# Patient Record
Sex: Male | Born: 1965 | Race: Black or African American | Hispanic: No | Marital: Single | Smoking: Current every day smoker
Health system: Southern US, Community
[De-identification: ages and names within clinical notes are randomized; demographics above are authoritative.]

## PROBLEM LIST (undated history)

## (undated) DIAGNOSIS — R011 Cardiac murmur, unspecified: Secondary | ICD-10-CM

## (undated) DIAGNOSIS — I10 Essential (primary) hypertension: Secondary | ICD-10-CM

---

## 2003-07-14 ENCOUNTER — Emergency Department (HOSPITAL_COMMUNITY): Admission: AD | Admit: 2003-07-14 | Discharge: 2003-07-14 | Payer: Self-pay | Admitting: Family Medicine

## 2004-06-26 ENCOUNTER — Emergency Department (HOSPITAL_COMMUNITY): Admission: EM | Admit: 2004-06-26 | Discharge: 2004-06-26 | Payer: Self-pay | Admitting: Emergency Medicine

## 2005-06-05 ENCOUNTER — Emergency Department (HOSPITAL_COMMUNITY): Admission: EM | Admit: 2005-06-05 | Discharge: 2005-06-05 | Payer: Self-pay | Admitting: Emergency Medicine

## 2005-08-30 ENCOUNTER — Emergency Department (HOSPITAL_COMMUNITY): Admission: EM | Admit: 2005-08-30 | Discharge: 2005-08-30 | Payer: Self-pay | Admitting: Emergency Medicine

## 2006-01-05 ENCOUNTER — Ambulatory Visit: Payer: Self-pay | Admitting: Family Medicine

## 2006-01-05 ENCOUNTER — Encounter (INDEPENDENT_AMBULATORY_CARE_PROVIDER_SITE_OTHER): Payer: Self-pay | Admitting: Internal Medicine

## 2006-01-05 LAB — CONVERTED CEMR LAB: Microalbumin U total vol: 0.31 mg/L

## 2006-01-08 ENCOUNTER — Ambulatory Visit: Payer: Self-pay | Admitting: *Deleted

## 2006-03-09 ENCOUNTER — Ambulatory Visit: Payer: Self-pay | Admitting: Internal Medicine

## 2007-05-31 ENCOUNTER — Encounter (INDEPENDENT_AMBULATORY_CARE_PROVIDER_SITE_OTHER): Payer: Self-pay | Admitting: Internal Medicine

## 2007-05-31 DIAGNOSIS — F17200 Nicotine dependence, unspecified, uncomplicated: Secondary | ICD-10-CM

## 2007-05-31 DIAGNOSIS — E1165 Type 2 diabetes mellitus with hyperglycemia: Secondary | ICD-10-CM

## 2007-06-04 DIAGNOSIS — E785 Hyperlipidemia, unspecified: Secondary | ICD-10-CM

## 2007-06-04 DIAGNOSIS — E1169 Type 2 diabetes mellitus with other specified complication: Secondary | ICD-10-CM

## 2007-09-03 ENCOUNTER — Emergency Department (HOSPITAL_COMMUNITY): Admission: EM | Admit: 2007-09-03 | Discharge: 2007-09-04 | Payer: Self-pay | Admitting: Emergency Medicine

## 2007-10-03 ENCOUNTER — Ambulatory Visit: Payer: Self-pay | Admitting: Internal Medicine

## 2007-10-03 LAB — CONVERTED CEMR LAB
Cholesterol: 180 mg/dL (ref 0–200)
GC Probe Amp, Urine: NEGATIVE
LDL Cholesterol: 96 mg/dL (ref 0–99)
VLDL: 42 mg/dL — ABNORMAL HIGH (ref 0–40)

## 2007-10-10 ENCOUNTER — Encounter: Admission: RE | Admit: 2007-10-10 | Discharge: 2007-10-10 | Payer: Self-pay | Admitting: Family Medicine

## 2007-10-21 ENCOUNTER — Inpatient Hospital Stay (HOSPITAL_COMMUNITY): Admission: EM | Admit: 2007-10-21 | Discharge: 2007-10-26 | Payer: Self-pay | Admitting: Psychiatry

## 2007-10-21 ENCOUNTER — Emergency Department (HOSPITAL_COMMUNITY): Admission: EM | Admit: 2007-10-21 | Discharge: 2007-10-21 | Payer: Self-pay | Admitting: Emergency Medicine

## 2007-10-31 ENCOUNTER — Ambulatory Visit: Payer: Self-pay | Admitting: Psychiatry

## 2007-12-16 ENCOUNTER — Emergency Department (HOSPITAL_COMMUNITY): Admission: EM | Admit: 2007-12-16 | Discharge: 2007-12-16 | Payer: Self-pay | Admitting: Emergency Medicine

## 2008-03-25 ENCOUNTER — Emergency Department (HOSPITAL_COMMUNITY): Admission: EM | Admit: 2008-03-25 | Discharge: 2008-03-25 | Payer: Self-pay | Admitting: Emergency Medicine

## 2009-02-25 ENCOUNTER — Ambulatory Visit: Payer: Self-pay | Admitting: Internal Medicine

## 2009-06-20 ENCOUNTER — Emergency Department (HOSPITAL_COMMUNITY): Admission: EM | Admit: 2009-06-20 | Discharge: 2009-06-20 | Payer: Self-pay | Admitting: Emergency Medicine

## 2009-07-28 ENCOUNTER — Ambulatory Visit: Payer: Self-pay | Admitting: Internal Medicine

## 2009-07-28 LAB — CONVERTED CEMR LAB
BUN: 16 mg/dL (ref 6–23)
Benzodiazepines.: NEGATIVE
CO2: 20 meq/L (ref 19–32)
Calcium: 9.4 mg/dL (ref 8.4–10.5)
Cholesterol: 152 mg/dL (ref 0–200)
Cocaine Metabolites: POSITIVE — AB
Creatinine,U: 168.9 mg/dL
Glucose, Bld: 122 mg/dL — ABNORMAL HIGH (ref 70–99)
Methadone: NEGATIVE
Opiate Screen, Urine: NEGATIVE
Phencyclidine (PCP): NEGATIVE
Propoxyphene: NEGATIVE
Sodium: 138 meq/L (ref 135–145)
Triglycerides: 109 mg/dL (ref <150)

## 2009-09-22 ENCOUNTER — Emergency Department (HOSPITAL_COMMUNITY): Admission: EM | Admit: 2009-09-22 | Discharge: 2009-09-22 | Payer: Self-pay | Admitting: Emergency Medicine

## 2009-10-28 ENCOUNTER — Ambulatory Visit: Payer: Self-pay | Admitting: Internal Medicine

## 2010-06-23 ENCOUNTER — Emergency Department (HOSPITAL_COMMUNITY): Admission: EM | Admit: 2010-06-23 | Discharge: 2010-06-23 | Payer: Self-pay | Admitting: Emergency Medicine

## 2010-11-30 LAB — GLUCOSE, CAPILLARY: Glucose-Capillary: 432 mg/dL — ABNORMAL HIGH (ref 70–99)

## 2010-12-04 LAB — BASIC METABOLIC PANEL
BUN: 12 mg/dL (ref 6–23)
CO2: 29 mEq/L (ref 19–32)
Calcium: 9.1 mg/dL (ref 8.4–10.5)
Chloride: 104 mEq/L (ref 96–112)
GFR calc non Af Amer: >60 mL/min (ref >60)
Glucose, Bld: 207 mg/dL — ABNORMAL HIGH (ref 70–99)
Sodium: 139 mEq/L (ref 135–145)

## 2010-12-04 LAB — GLUCOSE, CAPILLARY

## 2011-01-31 NOTE — H&P (Signed)
Gregory Michael, Gregory Michael                 ACCOUNT NO.:  [id number]   MEDICAL RECORD NO.:  [id number]          PATIENT TYPE:  IPS   LOCATION:  0508                          FACILITY:  [facility]   PHYSICIAN:  Anselm Jungling, MD  DATE OF BIRTH:  08/07/1966   DATE OF ADMISSION:  10/21/2007  DATE OF DISCHARGE:                       PSYCHIATRIC ADMISSION ASSESSMENT   A 45 year old male voluntarily admitted on October 21, 2007.   HISTORY OF PRESENT ILLNESS:  The patient presents with a history of  homicidal thoughts towards people who have wronged him in the past,  hearing auditory hallucinations telling him to hurt others.  He has no  history of violence.  He states that he has found himself on a downward  slope. He is currently unemployed, homeless, and has been using  substances.   PAST PSYCHIATRIC HISTORY:  This is a first admission to the [facility].  No other apparent psychiatric admissions.  He is  currently not being treated by a mental health Janyiah Silveri.   SOCIAL HISTORY:  A 45 year old married male.  He is currently separated,  has two adult children.  He considers himself homeless and unemployed.   FAMILY PSYCHIATRIC HISTORY:  None.   SOCIAL HISTORY:  The patient smokes cigarettes and has been doing some  recent drinking and marijuana use.  Denies any seizure activity.  Primary care Weston Fulco is [facility].   MEDICAL PROBLEMS:  Non-insulin-dependent diabetes and hypertension.   MEDICATIONS:  1. Metformin 500 mg b.i.d.  2. Lisinopril 5 mg daily.  3. Triamcinolone cream to his rash on his chest.   DRUG ALLERGIES:  No known allergies.   PHYSICAL EXAMINATION:  The patient was fully assessed at [facility]  emergency department.  Temperature is 98.4, heart rate 87, respirations  18, blood pressure 127/70.  He is 5 feet 5 inches tall, 148 pounds.   WBC 13.7.  Alcohol level was 108.  Urine drug screen is positive for  THC.  Glucose 194.   This is a middle-age male,  appears well nourished.  No acute distress.   MENTAL STATUS EXAM:  He is fully alert, cooperative, polite, casually  dressed.  Speech is clear, normal pace and tone.  The patient's mood is  neutral.  The patient's affect is flat and sad.  Thought process:  Endorsing auditory hallucinations.  He does not appear to be responding  actively to internal stimuli at this time. Endorsing homicidal ideation.  Does not appear aggressive or suspicious.  Cognitive function intact.  His memory is good.  Judgment and insight fair.   AXIS I:  Depressive disorder not otherwise specified, rule out bipolar  disorder, rule out alcohol abuse rule abuse or dependence,  tetrahydrocannabinol abuse.  AXIS II:  Deferred.  AXIS III:  1. Non-insulin-dependent diabetes.  2. Hypertension.  AXIS IV:  Problems with housing, occupation, psychosocial problems,  medical problems.  AXIS V:  Current is 40.   PLAN:  Contract for safety, stabilize mood and thinking.  Will check his  blood sugars twice a day, resume his medications, reinforce medication  compliance. Will have Risperdal available  for mood stabilization and  psychotic symptoms.  We will continue to assess comorbidities and assess  his support group.  Case manager will obtain his followup appointments.  Will also monitor withdrawal symptoms, work on relapse prevention.   His tentative length of stay is to 3-5 days.      Landry Corporal, N.P.      Anselm Jungling, MD  Electronically Signed    JO/MEDQ  D:  10/22/2007  T:  10/22/2007  Job:  [id number]

## 2011-02-03 NOTE — Discharge Summary (Signed)
NAMEAYHAM, WORD                 ACCOUNT NO.:  [id number]   MEDICAL RECORD NO.:  [id number]          PATIENT TYPE:  IPS   LOCATION:  0508                          FACILITY:  [facility]   PHYSICIAN:  Anselm Jungling, MD  DATE OF BIRTH:  08/31/1966   DATE OF ADMISSION:  10/21/2007  DATE OF DISCHARGE:  10/26/2007                               DISCHARGE SUMMARY   IDENTIFYING DATA AND REASON FOR ADMISSION:  This was an inpatient  psychiatric admission for Gregory Michael, a 45 year old single, homeless African  American male, who presented with a history of suicidal and homicidal  thoughts and self-reported auditory hallucinations telling him to hurt  others.  He had also been involved in substance abuse.  Please refer to  the admission note for further details pertaining to symptoms,  circumstances and history that led to his hospitalization.  He was given  an initial Axis I diagnosis of depressive disorder NOS, rule out bipolar  disorder, rule out alcohol abuse disorder and cannabis abuse.   MEDICAL AND LABORATORY:  The patient came to [place] with a history of non-  insulin-dependent diabetes mellitus and hypertension, neither of which  had been carefully followed in the recent past.  His usual medications  included metformin, lisinopril and triamcinolone cream to a rash that he  had had on his chest.  He was medically and physically assessed by the  psychiatric nurse practitioner.  There were no acute medical issues.   HOSPITAL COURSE:  The patient was admitted to the adult inpatient  psychiatric service.  He presented as a well-nourished, well-developed  [place] male who was alert, fully oriented and pleasant but  grim with depressed mood.  His thoughts and speech were normally  organized.  He denied any suicidal ideation.  There were no signs or  symptoms of psychosis or thought disorder.  He talked at length about  his resentments about various persons close to him that he felt had  taken  advantage of him and betrayed him.  He expressed having thoughts  to hurt some of these people, but did not name them, and indicated  clearly throughout his stay that he did not want to commit any acts of  violence, but he merely had those feelings.   He was involved in the therapeutic milieu, and was a good participant in  the treatment program.  He attended therapeutic groups and activities  geared towards developing better understanding of his underlying  disorders and dynamics, the development of an aftercare plan, and in  addition he attended 12-step recovery groups.  Although he denied much  in the way of active substance abuse, he agreed fully that he was an  individual that needed to live a clean and sober life, and because of  this he was accepting of the idea of attending 12-step meetings.   He was treated with a regimen of Risperdal 0.5 mg t.i.d. after an  initial trial of 0.25 mg t.i.d., to address agitation, tension and  difficulty sleeping.  Risperdal was very helpful and well tolerated.  The patient indicated enthusiasm about  continuing to use it.  He was  also given trazodone 50-100 mg at bedtime to help stabilize sleep.   The patient worked closely with the undersigned and the case manager  towards an aftercare plan.  We encouraged him to explore the possibility  of living in an [facility] for men in early recovery from alcohol and  drug abuse.  He was agreeable to that overall plan.   On the sixth hospital day the patient appeared stable enough for  discharge and agreed to following aftercare plan.   AFTERCARE:  The patient was to follow up at [facility] with an appointment on February 10.   DISCHARGE MEDICATIONS:  1. Glucophage 500 mg b.i.d.  2. Risperdal 0.5 mg t.i.d.  3. Prinivil 5 mg daily.  4. Triamcinolone cream twice daily to rash.  5. Trazodone 50-100 mg daily at bedtime p.r.n. insomnia.   DISCHARGE DIAGNOSES:  AXIS I:  Mood  disorder NOS.  Also, history of  substance abuse disorder NOS, early remission.  AXIS II:  Deferred.  AXIS III:  History of hypertension, rash, diabetes mellitus.  AXIS IV:  Stressors severe.  AXIS V:  GAF on discharge 55.      Anselm Jungling, MD  Electronically Signed     SPB/MEDQ  D:  10/29/2007  T:  10/30/2007  Job:  [id number]

## 2011-06-09 LAB — COMPREHENSIVE METABOLIC PANEL
ALT: 22
ALT: 23
AST: 18
AST: 18
Alkaline Phosphatase: 77
BUN: 18
CO2: 26
CO2: 28
Calcium: 9.4
Chloride: 104
Creatinine, Ser: 1.13
GFR calc non Af Amer: >60
Glucose, Bld: 152 — ABNORMAL HIGH
Glucose, Bld: 194 — ABNORMAL HIGH
Potassium: 3.8
Sodium: 138
Total Bilirubin: 0.6

## 2011-06-09 LAB — RAPID URINE DRUG SCREEN, HOSP PERFORMED
Barbiturates: NOT DETECTED
Cocaine: NOT DETECTED
Opiates: NOT DETECTED

## 2011-06-09 LAB — ETHANOL: Alcohol, Ethyl (B): 108 — ABNORMAL HIGH

## 2011-06-09 LAB — DIFFERENTIAL
Basophils Absolute: 0.1
Basophils Relative: 0
Eosinophils Absolute: 0.1
Eosinophils Relative: 1
Lymphs Abs: 2.4
Monocytes Absolute: 0.9
Monocytes Relative: 6
Neutro Abs: 10.3 — ABNORMAL HIGH
Neutrophils Relative %: 75

## 2011-06-09 LAB — CBC
HCT: 42.5
HCT: 46.8
Hemoglobin: 16.1
MCHC: 34.9
RDW: 13.6
RDW: 14.1
WBC: 12.4 — ABNORMAL HIGH

## 2011-06-23 LAB — CBC
Platelets: 199
WBC: 12.1 — ABNORMAL HIGH

## 2011-06-23 LAB — I-STAT 8, (EC8 V) (CONVERTED LAB)
Acid-Base Excess: 1
BUN: 13
Chloride: 103
Glucose, Bld: 160 — ABNORMAL HIGH
Hemoglobin: 16.3
Potassium: 3.3 — ABNORMAL LOW
TCO2: 26
pCO2, Ven: 34.5 — ABNORMAL LOW
pH, Ven: 7.461 — ABNORMAL HIGH

## 2011-06-23 LAB — DIFFERENTIAL
Basophils Absolute: 0
Lymphocytes Relative: 10 — ABNORMAL LOW
Monocytes Absolute: 1.1 — ABNORMAL HIGH
Neutro Abs: 9.8 — ABNORMAL HIGH

## 2011-06-23 LAB — URINALYSIS, ROUTINE W REFLEX MICROSCOPIC
Ketones, ur: 15 — AB
Protein, ur: NEGATIVE
Specific Gravity, Urine: 1.027
Urobilinogen, UA: 1
pH: 5.5

## 2011-06-23 LAB — INFLUENZA A+B VIRUS AG-DIRECT(RAPID): Influenza B Ag: NEGATIVE

## 2011-06-23 LAB — POCT I-STAT CREATININE: Creatinine, Ser: 1.2

## 2012-04-13 ENCOUNTER — Encounter (HOSPITAL_COMMUNITY): Payer: Self-pay | Admitting: Nurse Practitioner

## 2012-04-13 ENCOUNTER — Emergency Department (HOSPITAL_COMMUNITY)
Admission: EM | Admit: 2012-04-13 | Discharge: 2012-04-13 | Disposition: A | Discharge status: Home / Self Care | Payer: Self-pay | Attending: Emergency Medicine | Admitting: Emergency Medicine

## 2012-04-13 DIAGNOSIS — R42 Dizziness and giddiness: Secondary | ICD-10-CM | POA: Insufficient documentation

## 2012-04-13 DIAGNOSIS — E119 Type 2 diabetes mellitus without complications: Secondary | ICD-10-CM | POA: Insufficient documentation

## 2012-04-13 DIAGNOSIS — R011 Cardiac murmur, unspecified: Secondary | ICD-10-CM | POA: Insufficient documentation

## 2012-04-13 DIAGNOSIS — F17200 Nicotine dependence, unspecified, uncomplicated: Secondary | ICD-10-CM | POA: Insufficient documentation

## 2012-04-13 HISTORY — DX: Cardiac murmur, unspecified: R01.1

## 2012-04-13 LAB — POCT I-STAT, CHEM 8
Calcium, Ion: 1.24 mmol/L — ABNORMAL HIGH (ref 1.12–1.23)
Creatinine, Ser: 0.9 mg/dL (ref 0.50–1.35)
Glucose, Bld: 200 mg/dL — ABNORMAL HIGH (ref 70–99)
HCT: 44 % (ref 39.0–52.0)
Hemoglobin: 15 g/dL (ref 13.0–17.0)
Potassium: 3.9 mEq/L (ref 3.5–5.1)
TCO2: 24 mmol/L (ref 0–100)

## 2012-04-13 LAB — GLUCOSE, CAPILLARY: Glucose-Capillary: 204 mg/dL — ABNORMAL HIGH (ref 70–99)

## 2012-04-13 NOTE — ED Provider Notes (Signed)
History     CSN: [id number]  Arrival date & time 04/13/12  0917   First MD Initiated Contact with Patient 04/13/12 1011      Chief Complaint  Patient presents with  . Dizziness    (Consider location/radiation/quality/duration/timing/severity/associated sxs/prior treatment) HPI Comments: Patient with history of diabetes controlled with metformin, presents with three-day history of lightheadedness. Patient states that he becomes very lightheaded when he stands or gets up after sleeping. He states that it feels like he is going to pass out. Patient does not have any palpitations or chest pain. He denies head injury or headache. He denies weakness in his extremities. He denies trouble talking. He denies confusion. Patient states that he has been under much more stress than usual lately and thinks that the stress is causing his symptoms. He states he has also been having trouble sleeping. Nothing makes symptoms better. Standing makes symptoms worse. Onset was acute. Course is intermittent. 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  The history is provided by the patient.    Past Medical History  Diagnosis Date  . Diabetes mellitus   . Heart murmur     History reviewed. No pertinent past surgical history.  History reviewed. No pertinent family history.  History  Substance Use Topics  . Smoking status: Current Everyday Smoker -- 0.5 packs/day  . Smokeless tobacco: Not on file  . Alcohol Use: Yes     rare      Review of Systems  Constitutional: Negative for fever.  HENT: Positive for congestion and rhinorrhea. Negative for sore throat.   Eyes: Negative for redness and visual disturbance.  Respiratory: Negative for cough.   Cardiovascular: Negative for chest pain, palpitations and leg swelling.  Gastrointestinal: Negative for nausea, vomiting, abdominal pain and diarrhea.  Genitourinary: Negative for dysuria.    Musculoskeletal: Negative for myalgias.  Skin: Negative for rash.  Neurological: Positive for light-headedness. Negative for numbness and headaches.  Psychiatric/Behavioral: Negative for confusion.    Allergies  Insulin detemir  Home Medications   Current Outpatient Rx  Name Route Sig Dispense Refill  . METFORMIN HCL 500 MG PO TABS Oral Take 500 mg by mouth 2 (two) times daily with a meal.      BP 121/72  Pulse 85  Temp 98.5 F (36.9 C) (Oral)  Resp 16  SpO2 98%  Physical Exam  Nursing note and vitals reviewed. Constitutional: He appears well-developed and well-nourished.  HENT:  Head: Normocephalic and atraumatic.  Right Ear: Tympanic membrane, external ear and ear canal normal.  Left Ear: Tympanic membrane, external ear and ear canal normal.  Nose: Nose normal. No mucosal edema or rhinorrhea.  Mouth/Throat: Uvula is midline and oropharynx is clear and moist. Mucous membranes are not dry.  Eyes: Conjunctivae are normal. Right eye exhibits no discharge. Left eye exhibits no discharge.  Neck: Normal range of motion. Neck supple.  Cardiovascular: Normal rate, regular rhythm and normal heart sounds.   Pulmonary/Chest: Effort normal and breath sounds normal.  Abdominal: Soft. There is no tenderness.  Neurological: He is alert.  Skin: Skin is warm and dry.  Psychiatric: He has a normal mood and affect.    ED Course  Procedures (including critical care time)  Labs Reviewed  GLUCOSE, CAPILLARY - Abnormal; Notable for the following:    Glucose-Capillary 204 (*)     All other components within normal limits   No results found.   1. Lightheadedness  10:32 AM Patient seen and examined. Work-up initiated.   Vital signs reviewed and are as follows: Filed Vitals:   04/13/12 0935  BP: 121/72  Pulse: 85  Temp: 98.5 F (36.9 C)  Resp: 16    Date: 04/13/2012  Rate: 71  Rhythm: normal sinus rhythm  QRS Axis: normal, borderline LAD  Intervals: normal  ST/T  Wave abnormalities: normal  Conduction Disutrbances:none  Narrative Interpretation: no qt prolongation, WPW, brugada  Old EKG Reviewed: none available  Patient was discussed with Dr. Effie Shy. No concerning findings on work-up. Patient informed of results and reassured. Urged rest and good hydration. Patient to continue DM treatment. Encouraged patient to establish care with PCP for further eval and routine care. Urged to return with CP, SOB, syncope, other concerns. Patient verbalizes understanding and agrees with plan.    MDM  Lightheadedness with standing suggesting orthostasis. No concerning findings on work-up. No SOB, CP. EKG unconcerning. Return instructions given. Do not suspect PE, cardiac etiology.         [place], [place] 04/13/12 [phone]

## 2012-04-13 NOTE — ED Notes (Signed)
Pt c/o "feeling lightheaded" x 3 days. Feels it may be r/t his blood sugar but does not have a meter. Denies vision changes or LOC. Denies pain. A&Ox4

## 2012-04-13 NOTE — ED Notes (Signed)
Reports intermittent episodes dizziness x 2-3 days especially with position changes. Had emesis x1 2 days ago.  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 Denies any h/a, chest pain.  No c/o dizziness presently.

## 2012-04-14 NOTE — ED Provider Notes (Signed)
Medical screening examination/treatment/procedure(s) were performed by non-physician practitioner and as supervising physician I was immediately available for consultation/collaboration.  Flint Melter, MD 04/14/12 2124

## 2012-12-24 ENCOUNTER — Encounter (HOSPITAL_COMMUNITY): Payer: Self-pay

## 2012-12-24 ENCOUNTER — Emergency Department (HOSPITAL_COMMUNITY)
Admission: EM | Admit: 2012-12-24 | Discharge: 2012-12-24 | Disposition: A | Discharge status: Home / Self Care | Payer: Self-pay | Source: Home / Self Care | Attending: Family Medicine | Admitting: Family Medicine

## 2012-12-24 DIAGNOSIS — F17200 Nicotine dependence, unspecified, uncomplicated: Secondary | ICD-10-CM

## 2012-12-24 DIAGNOSIS — E119 Type 2 diabetes mellitus without complications: Secondary | ICD-10-CM

## 2012-12-24 DIAGNOSIS — E785 Hyperlipidemia, unspecified: Secondary | ICD-10-CM | POA: Diagnosis present

## 2012-12-24 DIAGNOSIS — IMO0001 Reserved for inherently not codable concepts without codable children: Secondary | ICD-10-CM | POA: Diagnosis present

## 2012-12-24 LAB — CBC
Hemoglobin: 16.9 g/dL (ref 13.0–17.0)
MCH: 31.9 pg (ref 26.0–34.0)
MCHC: 36.9 g/dL — ABNORMAL HIGH (ref 30.0–36.0)
RDW: 12.8 % (ref 11.5–15.5)

## 2012-12-24 LAB — COMPREHENSIVE METABOLIC PANEL
ALT: 19 U/L (ref 0–53)
Albumin: 4.3 g/dL (ref 3.5–5.2)
Calcium: 10.4 mg/dL (ref 8.4–10.5)
GFR calc Af Amer: >90 mL/min (ref >90)
Glucose, Bld: 266 mg/dL — ABNORMAL HIGH (ref 70–99)
Sodium: 136 mEq/L (ref 135–145)
Total Protein: 7.5 g/dL (ref 6.0–8.3)

## 2012-12-24 LAB — LIPID PANEL
Cholesterol: 213 mg/dL — ABNORMAL HIGH (ref 0–200)
HDL: 47 mg/dL (ref >39)
Triglycerides: 249 mg/dL — ABNORMAL HIGH (ref <150)
VLDL: 50 mg/dL — ABNORMAL HIGH (ref 0–40)

## 2012-12-24 MED ORDER — METFORMIN HCL ER 500 MG PO TB24: 500.0000 mg | ORAL_TABLET | Freq: Two times a day (BID) | ORAL | Status: DC

## 2012-12-24 NOTE — ED Notes (Signed)
Patient states here for A1C and medicatioin refill History of DM

## 2012-12-24 NOTE — ED Provider Notes (Signed)
History    CSN: [id number]  Arrival date & time 12/24/12  1224   First MD Initiated Contact with Patient 12/24/12 1426      Chief Complaint  Patient presents with  . Diabetes  . Medication Refill   HPI Pt presents today to get refills of his metformin and to have his A1c checked.  He has a history of a heart murmur.  He says that he has not been follow a medical doctor regularly and has not been checked in over a year.  He is not keen for regular office visits.  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    Past Medical History  Diagnosis Date  . Diabetes mellitus   . Heart murmur     History reviewed. No pertinent past surgical history.  Family History  Diabetes Mellitus   History  Substance Use Topics  . Smoking status: Current Every Day Smoker -- 0.50 packs/day  . Smokeless tobacco: Not on file  . Alcohol Use: Yes     Comment: rare    Review of Systems Constitutional: Negative.  HENT: Negative.  Respiratory: Negative.  Cardiovascular: Negative.  Gastrointestinal: Negative.  Endocrine: Negative.  Genitourinary: Negative.  Musculoskeletal: Negative.  Skin: Negative.  Allergic/Immunologic: Negative.  Neurological: Negative.  Hematological: Negative.  Psychiatric/Behavioral: Negative.  All other systems reviewed and are negative   Allergies  Insulin detemir  Home Medications   Current Outpatient Rx  Name  Route  Sig  Dispense  Refill  . metFORMIN (GLUCOPHAGE) 500 MG tablet   Oral   Take 500 mg by mouth 2 (two) times daily with a meal.           BP 140/82  Pulse 84  Temp(Src) 98.3 F (36.8 C) (Oral)  Resp 20  SpO2 100%  Physical Exam Nursing note and vitals reviewed.  Constitutional: He is oriented to person, place, and time. He appears well-developed and well-nourished. No distress.  Eyes: Conjunctivae and EOM are normal. Pupils are equal, round, and reactive to light.  Neck: Normal range of motion.  Neck supple. No JVD present. No thyromegaly present.  Cardiovascular: Normal rate, regular rhythm and normal heart sounds.  No murmur heard.  Pulmonary/Chest: Effort normal and breath sounds normal. No respiratory distress.  Abdominal: Soft. Bowel sounds are normal.  Musculoskeletal: Normal range of motion. He exhibits no edema.  Lymphadenopathy:  He has no cervical adenopathy.  Neurological: He is oriented to person, place, and time. Coordination normal.  Skin: Skin is warm and dry. No rash noted. No erythema. No pallor.  Psychiatric: He has a normal mood and affect. His behavior is normal. Judgment and thought content normal.   ED Course  Procedures (including critical care time)  Labs Reviewed - No data to display No results found.  No diagnosis found.  MDM  IMPRESSION  Type 2 diabetes mellitus  Elevated blood pressure   History of hyperlipidemia   RECOMMENDATIONS / PLAN Check lipids, check other labs today  Refilled metformin medication The patient was counseled on the dangers of tobacco use, and was advised to quit.  Reviewed strategies to maximize success, including removing cigarettes and smoking materials from environment and stress management.   FOLLOW UP 4 months   The patient was given clear instructions to go to ER or return to medical center if symptoms don't improve, worsen or new problems develop.  The patient verbalized understanding.  The patient was told to call  to get lab results if they haven't heard anything in the next week.            Cleora Fleet, MD 12/24/12 1435

## 2012-12-25 ENCOUNTER — Telehealth (HOSPITAL_COMMUNITY): Payer: Self-pay

## 2012-12-25 LAB — HEMOGLOBIN A1C: Hgb A1c MFr Bld: 10 % — ABNORMAL HIGH (ref <5.7)

## 2012-12-25 LAB — MICROALBUMIN / CREATININE URINE RATIO
Creatinine, Urine: 80.4 mg/dL
Microalb Creat Ratio: 6.2 mg/g (ref 0.0–30.0)

## 2012-12-25 NOTE — ED Notes (Signed)
Left message on machine to please return our call

## 2012-12-25 NOTE — Progress Notes (Signed)
Quick Note:  Also, the patient had a low Vit D level so please call in some Vit D (Drisdol 50,000 IU caps) - take 1 po once per week, #8, No refills.   Rodney Langton, MD, CDE, FAAFP [facility] [place], [place]   ______

## 2012-12-25 NOTE — Progress Notes (Signed)
Quick Note:  Please inform patient that his blood sugar came back elevated and his A1c is 10% which means that his diabetes is poorly controlled. He is at high risk for acute and chronic complications of poorly controlled diabetes mellitus. He needs to increase his metformin to 2 tabs with breakfast and 2 tabs with supper for a total of 1000 mg twice daily. He needs to work on improving his diet. No regular sodas or concentrated sweets. His cholesterol is elevated and he needs to take some cholesterol lowering medications. Please call in Pravastatin 40 mg - Take 1 po QHS, #30, RFx 3. Pt needs to return in 3 months and have his labs rechecked and follow up on diabetes care.   Rodney Langton, MD, CDE, FAAFP [facility] [place], [place]   ______

## 2012-12-25 NOTE — ED Notes (Signed)
Spoke with patient about his labs Made him aware to increase his metformin to 2 tabs with breakfast and dinner Watch his diet Prescription for pravastatin 40mg  & vit D called into [facility] [place] [phone]- left on pharmacy machine

## 2013-01-29 ENCOUNTER — Telehealth: Payer: Self-pay

## 2013-01-29 NOTE — Telephone Encounter (Signed)
Diabetes script written on 12/24/12 has expired. Pharmacy,  [facility], asking to have script resent.

## 2013-02-03 NOTE — Telephone Encounter (Signed)
Pt called again, [facility] still has not received script.

## 2013-04-02 NOTE — Telephone Encounter (Signed)
Left message for pt to schedule appt for f/u.

## 2013-04-10 ENCOUNTER — Ambulatory Visit: Payer: Commercial Managed Care - PPO | Attending: Family Medicine | Admitting: Family Medicine

## 2013-04-10 ENCOUNTER — Encounter: Payer: Self-pay | Admitting: Family Medicine

## 2013-04-10 VITALS — BP 151/103 | HR 97 | Temp 99.0°F | Resp 16 | Wt 163.0 lb

## 2013-04-10 DIAGNOSIS — E559 Vitamin D deficiency, unspecified: Secondary | ICD-10-CM | POA: Insufficient documentation

## 2013-04-10 DIAGNOSIS — E785 Hyperlipidemia, unspecified: Secondary | ICD-10-CM | POA: Insufficient documentation

## 2013-04-10 DIAGNOSIS — E1110 Type 2 diabetes mellitus with ketoacidosis without coma: Secondary | ICD-10-CM

## 2013-04-10 DIAGNOSIS — IMO0001 Reserved for inherently not codable concepts without codable children: Secondary | ICD-10-CM | POA: Insufficient documentation

## 2013-04-10 DIAGNOSIS — I10 Essential (primary) hypertension: Secondary | ICD-10-CM | POA: Insufficient documentation

## 2013-04-10 DIAGNOSIS — E1310 Other specified diabetes mellitus with ketoacidosis without coma: Secondary | ICD-10-CM

## 2013-04-10 DIAGNOSIS — F17200 Nicotine dependence, unspecified, uncomplicated: Secondary | ICD-10-CM

## 2013-04-10 LAB — COMPLETE METABOLIC PANEL WITH GFR
Albumin: 4.4 g/dL (ref 3.5–5.2)
BUN: 11 mg/dL (ref 6–23)
Calcium: 9.8 mg/dL (ref 8.4–10.5)
Chloride: 102 mEq/L (ref 96–112)
GFR, Est African American: >89 mL/min
GFR, Est Non African American: >89 mL/min
Glucose, Bld: 309 mg/dL — ABNORMAL HIGH (ref 70–99)
Potassium: 4.3 mEq/L (ref 3.5–5.3)

## 2013-04-10 LAB — GLUCOSE, POCT (MANUAL RESULT ENTRY): POC Glucose: 314 mg/dl — AB (ref 70–99)

## 2013-04-10 LAB — LIPID PANEL: Cholesterol: 177 mg/dL (ref 0–200)

## 2013-04-10 LAB — TSH: TSH: 0.684 u[IU]/mL (ref 0.350–4.500)

## 2013-04-10 MED ORDER — RELION CONFIRM GLUCOSE MONITOR W/DEVICE KIT: 1.0000 [IU] | PACK | Status: AC

## 2013-04-10 MED ORDER — METFORMIN HCL ER 500 MG PO TB24: 500.0000 mg | ORAL_TABLET | Freq: Two times a day (BID) | ORAL | Status: DC

## 2013-04-10 MED ORDER — LISINOPRIL-HYDROCHLOROTHIAZIDE 10-12.5 MG PO TABS: 1.0000 | ORAL_TABLET | Freq: Every day | ORAL | Status: DC

## 2013-04-10 MED ORDER — PRAVASTATIN SODIUM 40 MG PO TABS: 40.0000 mg | ORAL_TABLET | Freq: Every day | ORAL | Status: DC

## 2013-04-10 MED ORDER — GLUCOSE BLOOD VI STRP: ORAL_STRIP | Status: AC

## 2013-04-10 NOTE — Progress Notes (Signed)
Patient ID: Gregory Michael, male   DOB: March 27, 1966, 47 y.o.   MRN: [id number]  CC: follow up   HPI: Pt says that he needs refills of his medications.  Pt has not taken metformin for 1 week.  Pt works as a [employer].  Pt is still smoking cigarettes.  Pt very hesitant to take blood pressure medications.  He says that he is not keen on taking medications.  He says that when he doesn't take his metformin he feels ill from the effects of hyperglycemia.   Allergies  Allergen Reactions  . Insulin Detemir     REACTION: Rash   Past Medical History  Diagnosis Date  . Diabetes mellitus   . Heart murmur    No current outpatient prescriptions on file prior to visit.   No current facility-administered medications on file prior to visit.   No family history on file. History   Social History  . Marital Status: Single    Spouse Name: N/A    Number of Children: N/A  . Years of Education: N/A   Occupational History  . Not on file.   Social History Main Topics  . Smoking status: Current Every Day Smoker -- 0.50 packs/day  . Smokeless tobacco: Not on file  . Alcohol Use: Yes     Comment: rare  . Drug Use: No  . Sexually Active:    Other Topics Concern  . Not on file   Social History Narrative  . No narrative on file    Review of Systems  Constitutional: Negative for fever, chills, diaphoresis, activity change, appetite change and fatigue.  HENT: Negative for ear pain, nosebleeds, congestion, facial swelling, rhinorrhea, neck pain, neck stiffness and ear discharge.   Eyes: Negative for pain, discharge, redness, itching and visual disturbance.  Respiratory: Negative for cough, choking, chest tightness, shortness of breath, wheezing and stridor.   Cardiovascular: Negative for chest pain, palpitations and leg swelling.  Gastrointestinal: Negative for abdominal distention.  Genitourinary: Negative for dysuria, urgency, frequency, hematuria, flank pain, decreased urine volume, difficulty  urinating and dyspareunia.  Musculoskeletal: Negative for back pain, joint swelling, arthralgias and gait problem.  Neurological: Negative for dizziness, tremors, seizures, syncope, facial asymmetry, speech difficulty, weakness, light-headedness, numbness and headaches.  Hematological: Negative for adenopathy. Does not bruise/bleed easily.  Psychiatric/Behavioral: Negative for hallucinations, behavioral problems, confusion, dysphoric mood, decreased concentration and agitation.    Objective:   Filed Vitals:   04/10/13 1245  BP: 151/103  Pulse:   Temp:   Resp:     Physical Exam  Constitutional: Appears well-developed and well-nourished. No distress.  HENT: Normocephalic. External right and left ear normal. Oropharynx is clear and dry mucous membranes. dental caries.  Eyes: Conjunctivae and EOM are normal. PERRLA, no scleral icterus.  Neck: Normal ROM. Neck supple. No JVD. No tracheal deviation. No thyromegaly.  CVS: RRR, S1/S2 +, no murmurs, no gallops, no carotid bruit.  Pulmonary: Effort and breath sounds normal, no stridor, rhonchi, wheezes, rales.  Abdominal: Soft. BS +,  no distension, tenderness, rebound or guarding.  Musculoskeletal: Normal range of motion. No edema and no tenderness.  Lymphadenopathy: No lymphadenopathy noted, cervical, inguinal. Neuro: Alert. Normal reflexes, muscle tone coordination. No cranial nerve deficit. Skin: Skin is warm and dry. No rash noted. Not diaphoretic. No erythema. No pallor.  Psychiatric: Normal mood and affect. Behavior, judgment, thought content normal.   Lab Results  Component Value Date   WBC 8.6 12/24/2012   HGB 16.9 12/24/2012   HCT  45.8 12/24/2012   MCV 86.6 12/24/2012   PLT 217 12/24/2012   Lab Results  Component Value Date   CREATININE 0.78 12/24/2012   BUN 11 12/24/2012   NA 136 12/24/2012   K 4.2 12/24/2012   CL 97 12/24/2012   CO2 28 12/24/2012    Lab Results  Component Value Date   HGBA1C 8.9% 04/10/2013   Lipid Panel      Component Value Date/Time   CHOL 213* 12/24/2012 1441   TRIG 249* 12/24/2012 1441   HDL 47 12/24/2012 1441   CHOLHDL 4.5 12/24/2012 1441   VLDL 50* 12/24/2012 1441   LDLCALC 116* 12/24/2012 1441       Assessment and plan:   Patient Active Problem List   Diagnosis Date Noted  . DM (diabetes mellitus) type 2, uncontrolled, with ketoacidosis 04/10/2013  . Unspecified essential hypertension 04/10/2013  . Unspecified vitamin D deficiency 04/10/2013  . Elevated blood pressure 12/24/2012  . Hyperlipidemia 12/24/2012  . HYPERLIPIDEMIA 06/04/2007  . DIABETES MELLITUS, TYPE II 05/31/2007  . DISORDER, TOBACCO USE 05/31/2007   DM (diabetes mellitus) type 2, uncontrolled, with ketoacidosis - Plan: HgB A1c, Glucose (CBG), Lipid panel, COMPLETE METABOLIC PANEL WITH GFR, TSH, Vitamin D 25 hydroxy, Microalbumin / creatinine urine ratio  Unspecified essential hypertension - Plan: Lipid panel, COMPLETE METABOLIC PANEL WITH GFR, TSH, Vitamin D 25 hydroxy, Microalbumin / creatinine urine ratio  Unspecified vitamin D deficiency - Plan: Lipid panel, COMPLETE METABOLIC PANEL WITH GFR, TSH, Vitamin D 25 hydroxy, Microalbumin / creatinine urine ratio  HYPERLIPIDEMIA  DISORDER, TOBACCO USE  The patient was counseled on the dangers of tobacco use, and was advised to quit.  Reviewed strategies to maximize success, including removing cigarettes and smoking materials from environment.  Trial of lisinopril HCT 10/12.5 take 1 po daily  Follow lab results  Counseled on medications  The patient was given clear instructions to go to ER or return to medical center if symptoms don't improve, worsen or new problems develop.  The patient verbalized understanding.  The patient was told to call to get any lab results if not heard anything in the next week.    RTC in 3 months  Rodney Langton, MD, CDE, FAAFP [facility] [place], [place]

## 2013-04-10 NOTE — Progress Notes (Signed)
Patient here for follow up on DM Medication refill

## 2013-04-10 NOTE — Patient Instructions (Addendum)
DASH Diet The DASH diet stands for "Dietary Approaches to Stop Hypertension." It is a healthy eating plan that has been shown to reduce high blood pressure (hypertension) in as little as 14 days, while also possibly providing other significant health benefits. These other health benefits include reducing the risk of breast cancer after menopause and reducing the risk of type 2 diabetes, heart disease, colon cancer, and stroke. Health benefits also include weight loss and slowing kidney failure in patients with chronic kidney disease.  DIET GUIDELINES  Limit salt (sodium). Your diet should contain less than 1500 mg of sodium daily.  Limit refined or processed carbohydrates. Your diet should include mostly whole grains. Desserts and added sugars should be used sparingly.  Include small amounts of heart-healthy fats. These types of fats include nuts, oils, and tub margarine. Limit saturated and trans fats. These fats have been shown to be harmful in the body. CHOOSING FOODS  The following food groups are based on a 2000 calorie diet. See your Registered Dietitian for individual calorie needs. Grains and Grain Products (6 to 8 servings daily)  Eat More Often: Whole-wheat bread, brown rice, whole-grain or wheat pasta, quinoa, popcorn without added fat or salt (air popped).  Eat Less Often: White bread, white pasta, white rice, cornbread. Vegetables (4 to 5 servings daily)  Eat More Often: Fresh, frozen, and canned vegetables. Vegetables may be raw, steamed, roasted, or grilled with a minimal amount of fat.  Eat Less Often/Avoid: Creamed or fried vegetables. Vegetables in a cheese sauce. Fruit (4 to 5 servings daily)  Eat More Often: All fresh, canned (in natural juice), or frozen fruits. Dried fruits without added sugar. One hundred percent fruit juice ( cup [237 mL] daily).  Eat Less Often: Dried fruits with added sugar. Canned fruit in light or heavy syrup. Lean Meats, Fish, and Poultry (2  servings or less daily. One serving is 3 to 4 oz [85-114 g]).  Eat More Often: Ninety percent or leaner ground beef, tenderloin, sirloin. Round cuts of beef, chicken breast, turkey breast. All fish. Grill, bake, or broil your meat. Nothing should be fried.  Eat Less Often/Avoid: Fatty cuts of meat, turkey, or chicken leg, thigh, or wing. Fried cuts of meat or fish. Dairy (2 to 3 servings)  Eat More Often: Low-fat or fat-free milk, low-fat plain or light yogurt, reduced-fat or part-skim cheese.  Eat Less Often/Avoid: Milk (whole, 2%).Whole milk yogurt. Full-fat cheeses. Nuts, Seeds, and Legumes (4 to 5 servings per week)  Eat More Often: All without added salt.  Eat Less Often/Avoid: Salted nuts and seeds, canned beans with added salt. Fats and Sweets (limited)  Eat More Often: Vegetable oils, tub margarines without trans fats, sugar-free gelatin. Mayonnaise and salad dressings.  Eat Less Often/Avoid: Coconut oils, palm oils, butter, stick margarine, cream, half and half, cookies, candy, pie. FOR MORE INFORMATION The Dash Diet Eating Plan: www.dashdiet.org Document Released: 08/24/2011 Document Revised: 11/27/2011 Document Reviewed: 08/24/2011 [employer] Patient Information 2014 [employer]. Hypertension As your heart beats, it forces blood through your arteries. This force is your blood pressure. If the pressure is too high, it is called hypertension (HTN) or high blood pressure. HTN is dangerous because you may have it and not know it. High blood pressure may mean that your heart has to work harder to pump blood. Your arteries may be narrow or stiff. The extra work puts you at risk for heart disease, stroke, and other problems.  Blood pressure consists of two numbers, a   higher number over a lower, 110/72, for example. It is stated as "110 over 72." The ideal is below 120 for the top number (systolic) and under 80 for the bottom (diastolic). Write down your blood pressure today. You  should pay close attention to your blood pressure if you have certain conditions such as:  Heart failure.  Prior heart attack.  Diabetes  Chronic kidney disease.  Prior stroke.  Multiple risk factors for heart disease. To see if you have HTN, your blood pressure should be measured while you are seated with your arm held at the level of the heart. It should be measured at least twice. A one-time elevated blood pressure reading (especially in the Emergency Department) does not mean that you need treatment. There may be conditions in which the blood pressure is different between your right and left arms. It is important to see your caregiver soon for a recheck. Most people have essential hypertension which means that there is not a specific cause. This type of high blood pressure may be lowered by changing lifestyle factors such as:  Stress.  Smoking.  Lack of exercise.  Excessive weight.  Drug/tobacco/alcohol use.  Eating less salt. Most people do not have symptoms from high blood pressure until it has caused damage to the body. Effective treatment can often prevent, delay or reduce that damage. TREATMENT  When a cause has been identified, treatment for high blood pressure is directed at the cause. There are a large number of medications to treat HTN. These fall into several categories, and your caregiver will help you select the medicines that are best for you. Medications may have side effects. You should review side effects with your caregiver. If your blood pressure stays high after you have made lifestyle changes or started on medicines,   Your medication(s) may need to be changed.  Other problems may need to be addressed.  Be certain you understand your prescriptions, and know how and when to take your medicine.  Be sure to follow up with your caregiver within the time frame advised (usually within two weeks) to have your blood pressure rechecked and to review your  medications.  If you are taking more than one medicine to lower your blood pressure, make sure you know how and at what times they should be taken. Taking two medicines at the same time can result in blood pressure that is too low. SEEK IMMEDIATE MEDICAL CARE IF:  You develop a severe headache, blurred or changing vision, or confusion.  You have unusual weakness or numbness, or a faint feeling.  You have severe chest or abdominal pain, vomiting, or breathing problems. MAKE SURE YOU:   Understand these instructions.  Will watch your condition.  Will get help right away if you are not doing well or get worse. Document Released: 09/04/2005 Document Revised: 11/27/2011 Document Reviewed: 04/24/2008 [facility] Patient Information 2014 [place], [place]. Hypoglycemia (Low Blood Sugar) Hypoglycemia is when the glucose (sugar) in your blood is too low. Hypoglycemia can happen for many reasons. It can happen to people with or without diabetes. Hypoglycemia can develop quickly and can be a medical emergency.  CAUSES  Having hypoglycemia does not mean that you will develop diabetes. Different causes include:  Missed or delayed meals or not enough carbohydrates eaten.  Medication overdose. This could be by accident or deliberate. If by accident, your medication may need to be adjusted or changed.  Exercise or increased activity without adjustments in carbohydrates or medications.  A nerve  disorder that affects body functions like your heart rate, blood pressure and digestion (autonomic neuropathy).  A condition where the stomach muscles do not function properly (gastroparesis). Therefore, medications may not absorb properly.  The inability to recognize the signs of hypoglycemia (hypoglycemic unawareness).  Absorption of insulin  may be altered.  Alcohol consumption.  Pregnancy/menstrual cycles/postpartum. This may be due to hormones.  Certain kinds of tumors. This is very rare. SYMPTOMS     Sweating.  Hunger.  Dizziness.  Blurred vision.  Drowsiness.  Weakness.  Headache.  Rapid heart beat.  Shakiness.  Nervousness. DIAGNOSIS  Diagnosis is made by monitoring blood glucose in one or all of the following ways:  Fingerstick blood glucose monitoring.  Laboratory results. TREATMENT  If you think your blood glucose is low:  Check your blood glucose, if possible. If it is less than 70 mg/dl, take one of the following:  3-4 glucose tablets.   cup juice (prefer clear like apple).   cup "regular" soda pop.  1 cup milk.  -1 tube of glucose gel.  5-6 hard candies.  Do not over treat because your blood glucose (sugar) will only go too high.  Wait 15 minutes and recheck your blood glucose. If it is still less than 70 mg/dl (or below your target range), repeat treatment.  Eat a snack if it is more than one hour until your next meal. Sometimes, your blood glucose may go so low that you are unable to treat yourself. You may need someone to help you. You may even pass out or be unable to swallow. This may require you to get an injection of glucagon, which raises the blood glucose. HOME CARE INSTRUCTIONS  Check blood glucose as recommended by your caregiver.  Take medication as prescribed by your caregiver.  Follow your meal plan. Do not skip meals. Eat on time.  If you are going to drink alcohol, drink it only with meals.  Check your blood glucose before driving.  Check your blood glucose before and after exercise. If you exercise longer or different than usual, be sure to check blood glucose more frequently.  Always carry treatment with you. Glucose tablets are the easiest to carry.  Always wear medical alert jewelry or carry some form of identification that states that you have diabetes. This will alert people that you have diabetes. If you have hypoglycemia, they will have a better idea on what to do. SEEK MEDICAL CARE IF:   You are having  problems keeping your blood sugar at target range.  You are having frequent episodes of hypoglycemia.  You feel you might be having side effects from your medicines.  You have symptoms of an illness that is not improving after 3-4 days.  You notice a change in vision or a new problem with your vision. SEEK IMMEDIATE MEDICAL CARE IF:   You are a family member or friend of a person whose blood glucose goes below 70 mg/dl and is accompanied by:  Confusion.  A change in mental status.  The inability to swallow.  Passing out. Document Released: 09/04/2005 Document Revised: 11/27/2011 Document Reviewed: 01/01/2012 [facility] Patient Information 2014 [place], [place]. Blood Sugar Monitoring, Adult GLUCOSE METERS FOR SELF-MONITORING OF BLOOD GLUCOSE  It is important to be able to correctly measure your blood sugar (glucose). You can use a blood glucose monitor (a small battery-operated device) to check your glucose level at any time. This allows you and your caregiver to monitor your diabetes and to determine how  well your treatment plan is working. The process of monitoring your blood glucose with a glucose meter is called self-monitoring of blood glucose (SMBG). When people with diabetes control their blood sugar, they have better health. To test for glucose with a typical glucose meter, place the disposable strip in the meter. Then place a small sample of blood on the "test strip." The test strip is coated with chemicals that combine with glucose in blood. The meter measures how much glucose is present. The meter displays the glucose level as a number. Several new models can record and store a number of test results. Some models can connect to personal computers to store test results or print them out.  Newer meters are often easier to use than older models. Some meters allow you to get blood from places other than your fingertip. Some new models have automatic timing, error codes, signals, or  barcode readers to help with proper adjustment (calibration). Some meters have a large display screen or spoken instructions for people with visual impairments.  INSTRUCTIONS FOR USING GLUCOSE METERS  Wash your hands with soap and warm water, or clean the area with alcohol. Dry your hands completely.  Prick the side of your fingertip with a lancet (a sharp-pointed tool used by hand).  Hold the hand down and gently milk the finger until a small drop of blood appears. Catch the blood with the test strip.  Follow the instructions for inserting the test strip and using the SMBG meter. Most meters require the meter to be turned on and the test strip to be inserted before applying the blood sample.  Record the test result.  Read the instructions carefully for both the meter and the test strips that go with it. Meter instructions are found in the user manual. Keep this manual to help you solve any problems that may arise. Many meters use "error codes" when there is a problem with the meter, the test strip, or the blood sample on the strip. You will need the manual to understand these error codes and fix the problem.  New devices are available such as laser lancets and meters that can test blood taken from "alternative sites" of the body, other than fingertips. However, you should use standard fingertip testing if your glucose changes rapidly. Also, use standard testing if:  You have eaten, exercised, or taken insulin in the past 2 hours.  You think your glucose is low.  You tend to not feel symptoms of low blood glucose (hypoglycemia).  You are ill or under stress.  Clean the meter as directed by the manufacturer.  Test the meter for accuracy as directed by the manufacturer.  Take your meter with you to your caregiver's office. This way, you can test your glucose in front of your caregiver to make sure you are using the meter correctly. Your caregiver can also take a sample of blood to test  using a routine lab method. If values on the glucose meter are close to the lab results, you and your caregiver will see that your meter is working well and you are using good technique. Your caregiver will advise you about what to do if the results do not match. FREQUENCY OF TESTING  Your caregiver will tell you how often you should check your blood glucose. This will depend on your type of diabetes, your current level of diabetes control, and your types of medicines. The following are general guidelines, but your care plan may be different. Record  all your readings and the time of day you took them for review with your caregiver.   Diabetes type 1.  When you are using insulin with good diabetic control (either multiple daily injections or via a pump), you should check your glucose 4 times a day.  If your diabetes is not well controlled, you may need to monitor more frequently, including before meals and 2 hours after meals, at bedtime, and occasionally between 2 a.m. and 3 a.m.  You should always check your glucose before a dose of insulin or before changing the rate on your insulin pump.  Diabetes type 2.  Guidelines for SMBG in diabetes type 2 are not as well defined.  If you are on insulin, follow the guidelines above.  If you are on medicines, but not insulin, and your glucose is not well controlled, you should test at least twice daily.  If you are not on insulin, and your diabetes is controlled with medicines or diet alone, you should test at least once daily, usually before breakfast.  A weekly profile will help your caregiver advise you on your care plan. The week before your visit, check your glucose before a meal and 2 hours after a meal at least daily. You may want to test before and after a different meal each day so you and your caregiver can tell how well controlled your blood sugars are throughout the course of a 24 hour period.  Gestational diabetes (diabetes during  pregnancy).  Frequent testing is often necessary. Accurate timing is important.  If you are not on insulin, check your glucose 4 times a day. Check it before breakfast and 1 hour after the start of each meal.  If you are on insulin, check your glucose 6 times a day. Check it before each meal and 1 hour after the first bite of each meal.  General guidelines.  More frequent testing is required at the start of insulin treatment. Your caregiver will instruct you.  Test your glucose any time you suspect you have low blood sugar (hypoglycemia).  You should test more often when you change medicines, when you have unusual stress or illness, or in other unusual circumstances. OTHER THINGS TO KNOW ABOUT GLUCOSE METERS  Measurement Range. Most glucose meters are able to read glucose levels over a broad range of values from as low as 0 to as high as 600 mg/dL. If you get an extremely high or low reading from your meter, you should first confirm it with another reading. Report very high or very low readings to your caregiver.  Whole Blood Glucose versus Plasma Glucose. Some older home glucose meters measure glucose in your whole blood. In a lab or when using some newer home glucose meters, the glucose is measured in your plasma (one component of blood). The difference can be important. It is important for you and your caregiver to know whether your meter gives its results as "whole blood equivalent" or "plasma equivalent."  Display of High and Low Glucose Values. Part of learning how to operate a meter is understanding what the meter results mean. Know how high and low glucose concentrations are displayed on your meter.  Factors that Affect Glucose Meter Performance. The accuracy of your test results depends on many factors and varies depending on the brand and type of meter. These factors include:  Low red blood cell count (anemia).  Substances in your blood (such as uric acid, vitamin C, and  others).  Environmental factors (temperature,  humidity, altitude).  Name-brand versus generic test strips.  Calibration. Make sure your meter is set up properly. It is a good idea to do a calibration test with a control solution recommended by the manufacturer of your meter whenever you begin using a fresh bottle of test strips. This will help verify the accuracy of your meter.  Improperly stored, expired, or defective test strips. Keep your strips in a dry place with the lid on.  Soiled meter.  Inadequate blood sample. NEW TECHNOLOGIES FOR GLUCOSE TESTING Alternative site testing Some glucose meters allow testing blood from alternative sites. These include the:  Upper arm.  Forearm.  Base of the thumb.  Thigh. Sampling blood from alternative sites may be desirable. However, it may have some limitations. Blood in the fingertips show changes in glucose levels more quickly than blood in other parts of the body. This means that alternative site test results may be different from fingertip test results, not because of the meter's ability to test accurately, but because the actual glucose concentration can be different.  Continuous Glucose Monitoring Devices to measure your blood glucose continuously are available, and others are in development. These methods can be more expensive than self-monitoring with a glucose meter. However, it is uncertain how effective and reliable these devices are. Your caregiver will advise you if this approach makes sense for you. IF BLOOD SUGARS ARE CONTROLLED, PEOPLE WITH DIABETES REMAIN HEALTHIER.  SMBG is an important part of the treatment plan of patients with diabetes mellitus. Below are reasons for using SMBG:   It confirms that your glucose is at a specific, healthy level.  It detects hypoglycemia and severe hyperglycemia.  It allows you and your caregiver to make adjustments in response to changes in lifestyle for individuals requiring  medicine.  It determines the need for starting insulin therapy in temporary diabetes that happens during pregnancy (gestational diabetes). Document Released: 09/07/2003 Document Revised: 11/27/2011 Document Reviewed: 12/29/2010 [facility] Patient Information 2014 [place], [place].

## 2013-04-11 LAB — MICROALBUMIN / CREATININE URINE RATIO: Microalb Creat Ratio: 7.4 mg/g (ref 0.0–30.0)

## 2013-04-11 NOTE — Progress Notes (Signed)
Quick Note:  Please inform patient that labs came back OK except that vitamin D level is still low. It has improved from 10 to 20. It needs to be 30. Recommend he take Prescription Vitamin D for 2 more months. Please call in Drisdol Vitamin D 50,000 IU caps, take 1 cap po once per week, #8, no refills. Recheck labs in 4 months.   Gregory Langton, MD, CDE, FAAFP [facility] [place], [place] ______

## 2013-04-14 ENCOUNTER — Telehealth: Payer: Self-pay | Admitting: *Deleted

## 2013-04-14 NOTE — Telephone Encounter (Signed)
04/14/13  Spoke with patient made aware of lab results vitamin D low  Recommend prescription Vitamin D for 2 months. Drisdol Vitamin D 50,000 IU cap called into [facility] pharmacy  On [address] per patient request. Patient verbalized and understands. P.Cathline Dowen,RN BSN [facility]

## 2013-06-11 ENCOUNTER — Encounter: Payer: Self-pay | Admitting: Family Medicine

## 2013-08-01 ENCOUNTER — Ambulatory Visit: Payer: Commercial Managed Care - PPO | Attending: Internal Medicine | Admitting: Internal Medicine

## 2013-08-01 ENCOUNTER — Encounter: Payer: Self-pay | Admitting: Internal Medicine

## 2013-08-01 VITALS — BP 137/83 | HR 80 | Temp 98.0°F | Resp 16 | Ht 67.5 in | Wt 157.0 lb

## 2013-08-01 DIAGNOSIS — E119 Type 2 diabetes mellitus without complications: Secondary | ICD-10-CM

## 2013-08-01 DIAGNOSIS — E1165 Type 2 diabetes mellitus with hyperglycemia: Secondary | ICD-10-CM

## 2013-08-01 DIAGNOSIS — K589 Irritable bowel syndrome without diarrhea: Secondary | ICD-10-CM

## 2013-08-01 DIAGNOSIS — I10 Essential (primary) hypertension: Secondary | ICD-10-CM

## 2013-08-01 DIAGNOSIS — Z23 Encounter for immunization: Secondary | ICD-10-CM

## 2013-08-01 MED ORDER — PRAVASTATIN SODIUM 40 MG PO TABS: 40.0000 mg | ORAL_TABLET | Freq: Every day | ORAL | Status: DC

## 2013-08-01 MED ORDER — DIPHENOXYLATE-ATROPINE 2.5-0.025 MG PO TABS: 1.0000 | ORAL_TABLET | Freq: Three times a day (TID) | ORAL | Status: DC

## 2013-08-01 MED ORDER — LISINOPRIL-HYDROCHLOROTHIAZIDE 10-12.5 MG PO TABS: 1.0000 | ORAL_TABLET | Freq: Every day | ORAL | Status: DC

## 2013-08-01 MED ORDER — GLIPIZIDE 10 MG PO TABS: 10.0000 mg | ORAL_TABLET | Freq: Two times a day (BID) | ORAL | Status: DC

## 2013-08-01 MED ORDER — SILDENAFIL CITRATE 100 MG PO TABS: 50.0000 mg | ORAL_TABLET | Freq: Every day | ORAL | Status: DC | PRN

## 2013-08-01 NOTE — Progress Notes (Unsigned)
Patient ID: Gregory Michael, male   DOB: 1966/07/22, 47 y.o.   MRN: [id number]   HPI: This is a 47 y/o male with DM, HTN and hyperlipidemia who comes in for a follow up. He tells me that he is having bowel movements within 30 minutes after eating meals whereas he was previously having one bowel movement a day. 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 He does not related to eating fatty foods or drinking milk. He also states that he's been having problems with getting an erection which is intermittent. He is taking his blood pressure and cholesterol medications appropriately but it is noted that his A1c has increased. He tells me he never filled his prescription for a glucometer and has not been checking his sugars at home.    Allergies  Allergen Reactions  . Insulin Detemir     REACTION: Rash   Past Medical History  Diagnosis Date  . Diabetes mellitus   . Heart murmur     History reviewed. No pertinent family history. History   Social History  . Marital Status: Single    Spouse Name: N/A    Number of Children: N/A  . Years of Education: N/A   Occupational History  . Not on file.   Social History Main Topics  . Smoking status: Current Every Day Smoker -- 0.50 packs/day  . Smokeless tobacco: Not on file  . Alcohol Use: Yes     Comment: rare  . Drug Use: No  . Sexual Activity:    Other Topics Concern  . Not on file   Social History Narrative  . No narrative on file   Current Outpatient Prescriptions on File Prior to Visit  Medication Sig Dispense Refill  . Blood Glucose Monitoring Suppl (RELION CONFIRM GLUCOSE MONITOR) W/DEVICE KIT 1 Units by Does not apply route as directed.  1 kit  1  . glucose blood (RELION CONFIRM/MICRO TEST) test strip Use as instructed  100 each  12  . metFORMIN (GLUCOPHAGE XR) 500 MG 24 hr tablet Take 1 tablet (500 mg total) by mouth 2 (two) times daily with a meal.  60 tablet  3   No current facility-administered  medications on file prior to visit.      Objective:   Filed Vitals:   08/01/13 0923  BP: 137/83  Pulse: 80  Temp: 98 F (36.7 C)  Resp: 16   Filed Weights   08/01/13 0923  Weight: 157 lb (71.215 kg)    Physical Exam ______ Constitutional: Appears well-developed and well-nourished. No distress. ____ HENT: Normocephalic. External right and left ear normal. Oropharynx is clear and moist. ____ Eyes: Conjunctivae and EOM are normal. PERRLA, no scleral icterus. ____ Neck: Normal ROM. Neck supple. No JVD. No tracheal deviation. No thyromegaly. ____ CVS: RRR, S1/S2 +, no murmurs, no gallops, no carotid bruit.  Pulmonary: Effort and breath sounds normal, no stridor, rhonchi, wheezes, rales.  Abdominal: Soft. BS +,  no distension, tenderness, rebound or guarding. ________ Musculoskeletal: Normal range of motion. No edema and no tenderness. ____ Neuro: Alert. Normal reflexes, muscle tone coordination. No cranial nerve deficit. Skin: Skin is warm and dry. No rash noted. Not diaphoretic. No erythema. No pallor. ____ Psychiatric: Normal mood and affect. Behavior, judgment, thought content normal. __  Lab Results  Component Value Date   WBC 8.6 12/24/2012   HGB 16.9 12/24/2012   HCT 45.8 12/24/2012   MCV 86.6 12/24/2012   PLT 217  12/24/2012   Lab Results  Component Value Date   CREATININE 0.97 04/10/2013   BUN 11 04/10/2013   NA 136 04/10/2013   K 4.3 04/10/2013   CL 102 04/10/2013   CO2 21 04/10/2013    Lab Results  Component Value Date   HGBA1C 10.3 08/01/2013   Lipid Panel     Component Value Date/Time   CHOL 177 04/10/2013 1254   TRIG 218* 04/10/2013 1254   HDL 40 04/10/2013 1254   CHOLHDL 4.4 04/10/2013 1254   VLDL 44* 04/10/2013 1254   LDLCALC 93 04/10/2013 1254        Patient Active Problem List   Diagnosis Date Noted  . DM (diabetes mellitus) type 2, uncontrolled, with ketoacidosis 04/10/2013  . Unspecified essential hypertension 04/10/2013  . Unspecified vitamin D  deficiency 04/10/2013  . Elevated blood pressure 12/24/2012  . Hyperlipidemia 12/24/2012  . HYPERLIPIDEMIA 06/04/2007  . DIABETES MELLITUS, TYPE II 05/31/2007  . DISORDER, TOBACCO USE 05/31/2007     Preventative Medicine:   Flu vaccine: Ordered     Assessment and plan: Diabetes mellitus type 2 -Have advised him to obtain glucometer and start checking his sugars as ordered -Have added glipizide 10 mg twice a day -Devised to control his diet  IBS -Given a prescription for Lomotil to be used prior to meals  Erectile dysfunction -Has been explained that this is likely due to his uncontrolled blood sugars -Has been given prescription for Viagra  Hypertension -Reasonably controlled - we'll give him refill  Hyperlipidemia -Refills given  Return to clinic in one month to recheck hemoglobin A1c   Calvert Cantor, MD

## 2013-08-01 NOTE — Progress Notes (Unsigned)
Pt is here following up on his diabetes and HTN. Pt reports that he is having a hard time gaining weight. He said that he is having ED

## 2013-08-04 ENCOUNTER — Other Ambulatory Visit: Payer: Commercial Managed Care - PPO

## 2013-08-22 ENCOUNTER — Ambulatory Visit: Payer: Commercial Managed Care - PPO

## 2013-11-19 ENCOUNTER — Ambulatory Visit: Payer: Commercial Managed Care - PPO | Admitting: Internal Medicine

## 2013-11-24 ENCOUNTER — Ambulatory Visit: Payer: Commercial Managed Care - PPO

## 2013-12-02 ENCOUNTER — Ambulatory Visit: Payer: Commercial Managed Care - PPO | Admitting: Internal Medicine

## 2013-12-02 ENCOUNTER — Ambulatory Visit: Payer: Commercial Managed Care - PPO

## 2014-01-28 ENCOUNTER — Encounter (HOSPITAL_COMMUNITY): Payer: Self-pay | Admitting: Emergency Medicine

## 2014-01-28 ENCOUNTER — Emergency Department (HOSPITAL_COMMUNITY)
Admission: EM | Admit: 2014-01-28 | Discharge: 2014-01-28 | Disposition: A | Discharge status: Home / Self Care | Payer: Commercial Managed Care - PPO | Attending: Emergency Medicine | Admitting: Emergency Medicine

## 2014-01-28 DIAGNOSIS — R42 Dizziness and giddiness: Secondary | ICD-10-CM

## 2014-01-28 DIAGNOSIS — Z79899 Other long term (current) drug therapy: Secondary | ICD-10-CM | POA: Insufficient documentation

## 2014-01-28 DIAGNOSIS — I10 Essential (primary) hypertension: Secondary | ICD-10-CM | POA: Insufficient documentation

## 2014-01-28 DIAGNOSIS — R011 Cardiac murmur, unspecified: Secondary | ICD-10-CM | POA: Insufficient documentation

## 2014-01-28 DIAGNOSIS — F17200 Nicotine dependence, unspecified, uncomplicated: Secondary | ICD-10-CM | POA: Insufficient documentation

## 2014-01-28 DIAGNOSIS — E119 Type 2 diabetes mellitus without complications: Secondary | ICD-10-CM | POA: Insufficient documentation

## 2014-01-28 HISTORY — DX: Essential (primary) hypertension: I10

## 2014-01-28 LAB — CBG MONITORING, ED: Glucose-Capillary: 221 mg/dL — ABNORMAL HIGH (ref 70–99)

## 2014-01-28 MED ORDER — GLIPIZIDE 10 MG PO TABS
10.0000 mg | ORAL_TABLET | Freq: Two times a day (BID) | ORAL | Status: DC
Start: 2014-01-28 — End: 2014-02-02

## 2014-01-28 MED ORDER — METFORMIN HCL ER 500 MG PO TB24: 500.0000 mg | ORAL_TABLET | Freq: Two times a day (BID) | ORAL | Status: DC

## 2014-01-28 MED ORDER — DIAZEPAM 5 MG PO TABS
5.0000 mg | ORAL_TABLET | Freq: Once | ORAL | Status: AC
  Administered 2014-01-28: 5 mg via ORAL
  Filled 2014-01-28: qty 1

## 2014-01-28 MED ORDER — MECLIZINE HCL 25 MG PO TABS
25.0000 mg | ORAL_TABLET | Freq: Once | ORAL | Status: AC
Start: 2014-01-28 — End: 2014-01-28
  Administered 2014-01-28: 25 mg via ORAL
  Filled 2014-01-28: qty 1

## 2014-01-28 MED ORDER — LISINOPRIL-HYDROCHLOROTHIAZIDE 10-12.5 MG PO TABS: 1.0000 | ORAL_TABLET | Freq: Every day | ORAL | Status: DC

## 2014-01-28 MED ORDER — MECLIZINE HCL 25 MG PO TABS: 25.0000 mg | ORAL_TABLET | Freq: Three times a day (TID) | ORAL | Status: DC | PRN

## 2014-01-28 MED ORDER — PRAVASTATIN SODIUM 40 MG PO TABS: 40.0000 mg | ORAL_TABLET | Freq: Every day | ORAL | Status: DC

## 2014-01-28 NOTE — ED Provider Notes (Signed)
CSN: [id number]     Arrival date & time 01/28/14  0154 History   First MD Initiated Contact with Patient 01/28/14 0157     Chief Complaint  Patient presents with  . Dizziness  . Weakness     HPI Patient reports awakening with acute onset of dizziness.  He describes this sensation of dizziness as the room spinning around.  Denies headache.  No weakness of his arms or legs.  No chest pain shortness of breath.  No other complaints.  No prior history of vertigo.  He states is better than when he was at home.  Initially rapid changes in this position would worsen his vertiginous symptoms.  He does have a history of diabetes and has been without his medications over the past 2 weeks.   Past Medical History  Diagnosis Date  . Diabetes mellitus   . Heart murmur   . Hypertension    History reviewed. No pertinent past surgical history. History reviewed. No pertinent family history. History  Substance Use Topics  . Smoking status: Current Every Day Smoker -- 0.50 packs/day  . Smokeless tobacco: Not on file  . Alcohol Use: Yes     Comment: rare    Review of Systems  All other systems reviewed and are negative.     Allergies  Insulin detemir  Home Medications   Prior to Admission medications   Medication Sig Start Date End Date Taking? Authorizing Provider  diphenoxylate-atropine (LOMOTIL) 2.5-0.025 MG per tablet Take 1 tablet by mouth 3 (three) times daily before meals. 08/01/13  Yes Debbe Odea, MD  sildenafil (VIAGRA) 100 MG tablet Take 0.5-1 tablets (50-100 mg total) by mouth daily as needed for erectile dysfunction. 08/01/13  Yes Debbe Odea, MD  Blood Glucose Monitoring Suppl (RELION CONFIRM GLUCOSE MONITOR) W/DEVICE KIT 1 Units by Does not apply route as directed. 04/10/13   Clanford Marisa Hua, MD  glipiZIDE (GLUCOTROL) 10 MG tablet Take 1 tablet (10 mg total) by mouth 2 (two) times daily before a meal. 01/28/14   Hoy Morn, MD  glucose blood (RELION CONFIRM/MICRO TEST)  test strip Use as instructed 04/10/13   Clanford Marisa Hua, MD  lisinopril-hydrochlorothiazide (PRINZIDE,ZESTORETIC) 10-12.5 MG per tablet Take 1 tablet by mouth daily. 01/28/14   Hoy Morn, MD  meclizine (ANTIVERT) 25 MG tablet Take 1 tablet (25 mg total) by mouth 3 (three) times daily as needed for dizziness. 01/28/14   Hoy Morn, MD  metFORMIN (GLUCOPHAGE XR) 500 MG 24 hr tablet Take 1 tablet (500 mg total) by mouth 2 (two) times daily with a meal. 01/28/14   Hoy Morn, MD  pravastatin (PRAVACHOL) 40 MG tablet Take 1 tablet (40 mg total) by mouth daily. 01/28/14   Hoy Morn, MD   BP 111/69  Pulse 81  Temp(Src) 97.7 F (36.5 C) (Oral)  Resp 15  Ht $R'5\' 7"'sw$  (1.702 m)  Wt 163 lb (73.936 kg)  BMI 25.52 kg/m2  SpO2 98% Physical Exam  Nursing note and vitals reviewed. Constitutional: He is oriented to person, place, and time. He appears well-developed and well-nourished.  HENT:  Head: Normocephalic and atraumatic.  Eyes: EOM are normal. Pupils are equal, round, and reactive to light.  Neck: Normal range of motion.  Cardiovascular: Normal rate, regular rhythm, normal heart sounds and intact distal pulses.   Pulmonary/Chest: Effort normal and breath sounds normal. No respiratory distress.  Abdominal: Soft. He exhibits no distension. There is no tenderness.  Musculoskeletal: Normal range of motion.  Neurological: He is alert and oriented to person, place, and time.  5/5 strength in major muscle groups of  bilateral upper and lower extremities. Speech normal. No facial asymetry.  Normal finger to nose bilaterally  Skin: Skin is warm and dry.  Psychiatric: He has a normal mood and affect. Judgment normal.    ED Course  Procedures (including critical care time) Labs Review Labs Reviewed  CBG MONITORING, ED - Abnormal; Notable for the following:    Glucose-Capillary 221 (*)    All other components within normal limits    Imaging Review No results found.   EKG  Interpretation None      MDM   Final diagnoses:  Vertigo    Patient feels much better at time of discharge.  His symptoms were resolving prior to my evaluation.  He was given meclizine and Valium with complete resolution of his symptoms.  Ambulatory.  Discharge home in good condition.  PCP followup.  In the [place] return to the ER for new or worsening symptoms.  Doubt posterior stroke    Hoy Morn, MD 01/28/14 [phone]

## 2014-01-28 NOTE — ED Notes (Signed)
Patient arrives from home with complaint of dizziness and weakness. Onset was upon waking about 20 mins ago. Patient states that he sometimes feels this way when his blood glucose gets high. Explains that he hasn't had any of his medications for 2 weeks. Mentioned medicines were medications for his diabetes and blood pressure.

## 2014-01-28 NOTE — Discharge Instructions (Signed)

## 2014-01-28 NOTE — ED Notes (Signed)
Patient requested prescriptions for his BP meds and DM meds.  Dr. Patria Maneampos gave 30 day supply.  This nurse instructed him to follow up with his MD for further refills.

## 2014-01-28 NOTE — ED Notes (Signed)
Dr. Campos at bedside   

## 2014-01-30 ENCOUNTER — Other Ambulatory Visit: Payer: Self-pay | Admitting: *Deleted

## 2014-01-30 NOTE — Telephone Encounter (Signed)
Patient called wanting a refill on medication. Informed patient he has not been seen since 07/2013 and will need to see PCP. Patient given an appointment for 02/02/2014 at 4:15 PM. Reather LaurenceJamie R Kymere Fullington, RN

## 2014-02-02 ENCOUNTER — Encounter: Payer: Self-pay | Admitting: Internal Medicine

## 2014-02-02 ENCOUNTER — Ambulatory Visit: Payer: Commercial Managed Care - PPO | Attending: Internal Medicine | Admitting: Internal Medicine

## 2014-02-02 VITALS — BP 140/90 | HR 98 | Temp 98.7°F | Resp 16 | Wt 159.2 lb

## 2014-02-02 DIAGNOSIS — I10 Essential (primary) hypertension: Secondary | ICD-10-CM | POA: Insufficient documentation

## 2014-02-02 DIAGNOSIS — E785 Hyperlipidemia, unspecified: Secondary | ICD-10-CM

## 2014-02-02 DIAGNOSIS — E119 Type 2 diabetes mellitus without complications: Secondary | ICD-10-CM

## 2014-02-02 DIAGNOSIS — Z79899 Other long term (current) drug therapy: Secondary | ICD-10-CM | POA: Insufficient documentation

## 2014-02-02 DIAGNOSIS — F17200 Nicotine dependence, unspecified, uncomplicated: Secondary | ICD-10-CM | POA: Insufficient documentation

## 2014-02-02 DIAGNOSIS — IMO0001 Reserved for inherently not codable concepts without codable children: Secondary | ICD-10-CM | POA: Insufficient documentation

## 2014-02-02 DIAGNOSIS — Z139 Encounter for screening, unspecified: Secondary | ICD-10-CM

## 2014-02-02 DIAGNOSIS — M62838 Other muscle spasm: Secondary | ICD-10-CM | POA: Insufficient documentation

## 2014-02-02 DIAGNOSIS — E1165 Type 2 diabetes mellitus with hyperglycemia: Principal | ICD-10-CM

## 2014-02-02 LAB — POCT GLYCOSYLATED HEMOGLOBIN (HGB A1C): HEMOGLOBIN A1C: 9.2

## 2014-02-02 LAB — GLUCOSE, POCT (MANUAL RESULT ENTRY): POC Glucose: 222 mg/dl — AB (ref 70–99)

## 2014-02-02 MED ORDER — CYCLOBENZAPRINE HCL 10 MG PO TABS: 10.0000 mg | ORAL_TABLET | Freq: Every day | ORAL | Status: DC

## 2014-02-02 MED ORDER — NICOTINE POLACRILEX 4 MG MT GUM: 4.0000 mg | CHEWING_GUM | OROMUCOSAL | Status: DC | PRN

## 2014-02-02 MED ORDER — METFORMIN HCL ER 500 MG PO TB24: 500.0000 mg | ORAL_TABLET | Freq: Two times a day (BID) | ORAL | Status: DC

## 2014-02-02 MED ORDER — PRAVASTATIN SODIUM 40 MG PO TABS: 40.0000 mg | ORAL_TABLET | Freq: Every day | ORAL | Status: DC

## 2014-02-02 MED ORDER — GLIPIZIDE 10 MG PO TABS: 10.0000 mg | ORAL_TABLET | Freq: Two times a day (BID) | ORAL | Status: DC

## 2014-02-02 MED ORDER — LISINOPRIL-HYDROCHLOROTHIAZIDE 10-12.5 MG PO TABS: 1.0000 | ORAL_TABLET | Freq: Every day | ORAL | Status: DC
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
## 2014-02-02 NOTE — Patient Instructions (Addendum)
Diabetes Meal Planning Guide The diabetes meal planning guide is a tool to help you plan your meals and snacks. It is important for people with diabetes to manage their blood glucose (sugar) levels. Choosing the right foods and the right amounts throughout your day will help control your blood glucose. Eating right can even help you improve your blood pressure and reach or maintain a healthy weight. CARBOHYDRATE COUNTING MADE EASY When you eat carbohydrates, they turn to sugar. This raises your blood glucose level. Counting carbohydrates can help you control this level so you feel better. When you plan your meals by counting carbohydrates, you can have more flexibility in what you eat and balance your medicine with your food intake. Carbohydrate counting simply means adding up the total amount of carbohydrate grams in your meals and snacks. Try to eat about the same amount at each meal. Foods with carbohydrates are listed below. Each portion below is 1 carbohydrate serving or 15 grams of carbohydrates. Ask your dietician how many grams of carbohydrates you should eat at each meal or snack. Grains and Starches  1 slice bread.   English muffin or hotdog/hamburger bun.   cup cold cereal (unsweetened).   cup cooked pasta or rice.   cup starchy vegetables (corn, potatoes, peas, beans, winter squash).  1 tortilla (6 inches).   bagel.  1 waffle or pancake (size of a CD).   cup cooked cereal.  4 to 6 small crackers. *Whole grain is recommended. Fruit  1 cup fresh unsweetened berries, melon, papaya, pineapple.  1 small fresh fruit.   banana or mango.   cup fruit juice (4 oz unsweetened).   cup canned fruit in natural juice or water.  2 tbs dried fruit.  12 to 15 grapes or cherries. Milk and Yogurt  1 cup fat-free or 1% milk.  1 cup soy milk.  6 oz light yogurt with sugar-free sweetener.  6 oz low-fat soy yogurt.  6 oz plain yogurt. Vegetables  1 cup raw or  cup  cooked is counted as 0 carbohydrates or a "free" food.  If you eat 3 or more servings at 1 meal, count them as 1 carbohydrate serving. Other Carbohydrates   oz chips or pretzels.   cup ice cream or frozen yogurt.   cup sherbet or sorbet.  2 inch square cake, no frosting.  1 tbs honey, sugar, jam, jelly, or syrup.  2 small cookies.  3 squares of graham crackers.  3 cups popcorn.  6 crackers.  1 cup broth-based soup.  Count 1 cup casserole or other mixed foods as 2 carbohydrate servings.  Foods with less than 20 calories in a serving may be counted as 0 carbohydrates or a "free" food. You may want to purchase a book or computer software that lists the carbohydrate gram counts of different foods. In addition, the nutrition facts panel on the labels of the foods you eat are a good source of this information. The label will tell you how big the serving size is and the total number of carbohydrate grams you will be eating per serving. Divide this number by 15 to obtain the number of carbohydrate servings in a portion. Remember, 1 carbohydrate serving equals 15 grams of carbohydrate. SERVING SIZES Measuring foods and serving sizes helps you make sure you are getting the right amount of food. The list below tells how big or small some common serving sizes are.  1 oz.........4 stacked dice.  3 oz.........Deck of cards.  1 tsp........Tip   of little finger.  1 tbs........Thumb.  2 tbs........Golf ball.   cup.......Half of a fist.  1 cup........A fist. SAMPLE DIABETES MEAL PLAN Below is a sample meal plan that includes foods from the grain and starches, dairy, vegetable, fruit, and meat groups. A dietician can individualize a meal plan to fit your calorie needs and tell you the number of servings needed from each food group. However, controlling the total amount of carbohydrates in your meal or snack is more important than making sure you include all of the food groups at every  meal. You may interchange carbohydrate containing foods (dairy, starches, and fruits). The meal plan below is an example of a 2000 calorie diet using carbohydrate counting. This meal plan has 17 carbohydrate servings. Breakfast  1 cup oatmeal (2 carb servings).   cup light yogurt (1 carb serving).  1 cup blueberries (1 carb serving).   cup almonds. Snack  1 large apple (2 carb servings).  1 low-fat string cheese stick. Lunch  Chicken breast salad.  1 cup spinach.   cup chopped tomatoes.  2 oz chicken breast, sliced.  2 tbs low-fat Italian dressing.  12 whole-wheat crackers (2 carb servings).  12 to 15 grapes (1 carb serving).  1 cup low-fat milk (1 carb serving). Snack  1 cup carrots.   cup hummus (1 carb serving). Dinner  3 oz broiled salmon.  1 cup brown rice (3 carb servings). Snack  1  cups steamed broccoli (1 carb serving) drizzled with 1 tsp olive oil and lemon juice.  1 cup light pudding (2 carb servings). DIABETES MEAL PLANNING WORKSHEET Your dietician can use this worksheet to help you decide how many servings of foods and what types of foods are right for you.  BREAKFAST Food Group and Servings / Carb Servings Grain/Starches __________________________________ Dairy __________________________________________ Vegetable ______________________________________ Fruit ___________________________________________ Meat __________________________________________ Fat ____________________________________________ LUNCH Food Group and Servings / Carb Servings Grain/Starches ___________________________________ Dairy ___________________________________________ Fruit ____________________________________________ Meat ___________________________________________ Fat _____________________________________________ DINNER Food Group and Servings / Carb Servings Grain/Starches ___________________________________ Dairy  ___________________________________________ Fruit ____________________________________________ Meat ___________________________________________ Fat _____________________________________________ SNACKS Food Group and Servings / Carb Servings Grain/Starches ___________________________________ Dairy ___________________________________________ Vegetable _______________________________________ Fruit ____________________________________________ Meat ___________________________________________ Fat _____________________________________________ DAILY TOTALS Starches _________________________ Vegetable ________________________ Fruit ____________________________ Dairy ____________________________ Meat ____________________________ Fat ______________________________ Document Released: 06/01/2005 Document Revised: 11/27/2011 Document Reviewed: 04/12/2009 ExitCare Patient Information 2014 [employer]. DASH Diet The DASH diet stands for "Dietary Approaches to Stop Hypertension." It is a healthy eating plan that has been shown to reduce high blood pressure (hypertension) in as little as 14 days, while also possibly providing other significant health benefits. These other health benefits include reducing the risk of breast cancer after menopause and reducing the risk of type 2 diabetes, heart disease, colon cancer, and stroke. Health benefits also include weight loss and slowing kidney failure in patients with chronic kidney disease.  DIET GUIDELINES  Limit salt (sodium). Your diet should contain less than 1500 mg of sodium daily.  Limit refined or processed carbohydrates. Your diet should include mostly whole grains. Desserts and added sugars should be used sparingly.  Include small amounts of heart-healthy fats. These types of fats include nuts, oils, and tub margarine. Limit saturated and trans fats. These fats have been shown to be harmful in the body. CHOOSING FOODS  The following food groups  are based on a 2000 calorie diet. See your Registered Dietitian for individual calorie needs. Grains and Grain Products (6 to 8 servings daily)  Eat More Often:   Whole-wheat bread, brown rice, whole-grain or wheat pasta, quinoa, popcorn without added fat or salt (air popped).  Eat Less Often: White bread, white pasta, white rice, cornbread. Vegetables (4 to 5 servings daily)  Eat More Often: Fresh, frozen, and canned vegetables. Vegetables may be raw, steamed, roasted, or grilled with a minimal amount of fat.  Eat Less Often/Avoid: Creamed or fried vegetables. Vegetables in a cheese sauce. Fruit (4 to 5 servings daily)  Eat More Often: All fresh, canned (in natural juice), or frozen fruits. Dried fruits without added sugar. One hundred percent fruit juice ( cup [237 mL] daily).  Eat Less Often: Dried fruits with added sugar. Canned fruit in light or heavy syrup. Lean Meats, Fish, and Poultry (2 servings or less daily. One serving is 3 to 4 oz [85-114 g]).  Eat More Often: Ninety percent or leaner ground beef, tenderloin, sirloin. Round cuts of beef, chicken breast, turkey breast. All fish. Grill, bake, or broil your meat. Nothing should be fried.  Eat Less Often/Avoid: Fatty cuts of meat, turkey, or chicken leg, thigh, or wing. Fried cuts of meat or fish. Dairy (2 to 3 servings)  Eat More Often: Low-fat or fat-free milk, low-fat plain or light yogurt, reduced-fat or part-skim cheese.  Eat Less Often/Avoid: Milk (whole, 2%).Whole milk yogurt. Full-fat cheeses. Nuts, Seeds, and Legumes (4 to 5 servings per week)  Eat More Often: All without added salt.  Eat Less Often/Avoid: Salted nuts and seeds, canned beans with added salt. Fats and Sweets (limited)  Eat More Often: Vegetable oils, tub margarines without trans fats, sugar-free gelatin. Mayonnaise and salad dressings.  Eat Less Often/Avoid: Coconut oils, palm oils, butter, stick margarine, cream, half and half, cookies, candy,  pie. FOR MORE INFORMATION The Dash Diet Eating Plan: www.dashdiet.org Document Released: 08/24/2011 Document Revised: 11/27/2011 Document Reviewed: 08/24/2011 ExitCare Patient Information 2014 [employer].  

## 2014-02-03 DIAGNOSIS — F17200 Nicotine dependence, unspecified, uncomplicated: Secondary | ICD-10-CM | POA: Insufficient documentation

## 2014-02-14 ENCOUNTER — Other Ambulatory Visit: Payer: Self-pay | Admitting: Internal Medicine

## 2014-03-05 ENCOUNTER — Encounter (HOSPITAL_COMMUNITY): Payer: Self-pay | Admitting: Emergency Medicine

## 2014-03-05 ENCOUNTER — Emergency Department (HOSPITAL_COMMUNITY)
Admission: EM | Admit: 2014-03-05 | Discharge: 2014-03-05 | Disposition: A | Discharge status: Home / Self Care | Payer: Commercial Managed Care - PPO | Attending: Emergency Medicine | Admitting: Emergency Medicine

## 2014-03-05 ENCOUNTER — Telehealth: Payer: Self-pay | Admitting: Internal Medicine

## 2014-03-05 DIAGNOSIS — Z79899 Other long term (current) drug therapy: Secondary | ICD-10-CM | POA: Insufficient documentation

## 2014-03-05 DIAGNOSIS — R011 Cardiac murmur, unspecified: Secondary | ICD-10-CM | POA: Insufficient documentation

## 2014-03-05 DIAGNOSIS — Z0000 Encounter for general adult medical examination without abnormal findings: Secondary | ICD-10-CM

## 2014-03-05 DIAGNOSIS — Z0289 Encounter for other administrative examinations: Secondary | ICD-10-CM | POA: Insufficient documentation

## 2014-03-05 DIAGNOSIS — I10 Essential (primary) hypertension: Secondary | ICD-10-CM | POA: Insufficient documentation

## 2014-03-05 DIAGNOSIS — E119 Type 2 diabetes mellitus without complications: Secondary | ICD-10-CM | POA: Insufficient documentation

## 2014-03-05 NOTE — ED Provider Notes (Signed)
Medical screening examination/treatment/procedure(s) were performed by non-physician practitioner and as supervising physician I was immediately available for consultation/collaboration.   EKG Interpretation None        Kristen N Ward, DO 03/05/14 1243 

## 2014-03-05 NOTE — Telephone Encounter (Signed)
Pt. Needs a letter for work stating his Vertigo does not prevent him from working.... Pt.'s job is requiring this for job in order to return to work...Marland Kitchen.Marland Kitchen.Please advise patient if letter can de done...Marland Kitchen..Marland Kitchen

## 2014-03-05 NOTE — ED Provider Notes (Signed)
emergencCSN: [id number]     Arrival date & time 03/05/14  0945 History   First MD Initiated Contact with Patient 03/05/14 0948    This chart was scribed for Noland Fordyce PA-C, a non-physician practitioner working with No att. providers found by Denice Bors, ED Scribe. This patient was seen in room TR06C/TR06C and the patient's care was started at 10:27 AM      Chief Complaint  Patient presents with  . Follow-up     (Consider location/radiation/quality/duration/timing/severity/associated sxs/prior Treatment) The history is provided by the patient. No language interpreter was used.   HPI Comments: Gregory Michael is a 48 y.o. male who presents to the Emergency Department requesting a work note stating he may return to work. Pt reports having vertigo last month, states he had returned to work without any problems but states he had some vacation time last week, when he returned to work today, his boss require him to have a note stating he may return to work. Pt states he feels well. He has no symptoms and does not understand why his work is requiring a note now. State she called the [facility] where he has his PCP and states he would not be seen until July 18th. Pt states he cannot afford to be out of work for that long. Denies dizziness, fever, n/v/d. Denies chest pain or SOB.   Past Medical History  Diagnosis Date  . Diabetes mellitus   . Heart murmur   . Hypertension    History reviewed. No pertinent past surgical history. No family history on file. History  Substance Use Topics  . Smoking status: Current Every Day Smoker -- 0.50 packs/day    Types: Cigarettes  . Smokeless tobacco: Not on file  . Alcohol Use: Yes     Comment: rare    Review of Systems  All other systems reviewed and are negative.     Allergies  Insulin detemir  Home Medications   Prior to Admission medications   Medication Sig Start Date End Date Taking? Authorizing Ariaunna Longsworth   Blood Glucose Monitoring Suppl (RELION CONFIRM GLUCOSE MONITOR) W/DEVICE KIT 1 Units by Does not apply route as directed. 04/10/13   Clanford Marisa Hua, MD  cyclobenzaprine (FLEXERIL) 10 MG tablet Take 1 tablet (10 mg total) by mouth at bedtime. 02/02/14   Lorayne Marek, MD  diphenoxylate-atropine (LOMOTIL) 2.5-0.025 MG per tablet Take 1 tablet by mouth 3 (three) times daily before meals. 08/01/13   Debbe Odea, MD  glipiZIDE (GLUCOTROL) 10 MG tablet Take 1 tablet (10 mg total) by mouth 2 (two) times daily before a meal. 02/02/14   Lorayne Marek, MD  glucose blood (RELION CONFIRM/MICRO TEST) test strip Use as instructed 04/10/13   Clanford Marisa Hua, MD  lisinopril-hydrochlorothiazide (PRINZIDE,ZESTORETIC) 10-12.5 MG per tablet Take 1 tablet by mouth daily. 02/02/14   Lorayne Marek, MD  meclizine (ANTIVERT) 25 MG tablet Take 1 tablet (25 mg total) by mouth 3 (three) times daily as needed for dizziness. 01/28/14   Hoy Morn, MD  metFORMIN (GLUCOPHAGE XR) 500 MG 24 hr tablet Take 1 tablet (500 mg total) by mouth 2 (two) times daily with a meal. 02/02/14   Lorayne Marek, MD  nicotine polacrilex (NICORETTE) 4 MG gum Take 1 each (4 mg total) by mouth as needed for smoking cessation. 02/02/14   Lorayne Marek, MD  pravastatin (PRAVACHOL) 40 MG tablet Take 1 tablet (40 mg total) by mouth daily. 02/02/14   Lorayne Marek, MD  sildenafil (VIAGRA) 100 MG tablet Take 0.5-1 tablets (50-100 mg total) by mouth daily as needed for erectile dysfunction. 08/01/13   Debbe Odea, MD   BP 136/74  Pulse 85  Temp(Src) 98.2 F (36.8 C) (Oral)  SpO2 100% Physical Exam  Nursing note and vitals reviewed. Constitutional: He is oriented to person, place, and time. He appears well-developed and well-nourished.  HENT:  Head: Normocephalic and atraumatic.  Eyes: Conjunctivae and EOM are normal. Pupils are equal, round, and reactive to light. No scleral icterus.  Neck: Normal range of motion. Neck supple.  Cardiovascular:  Normal rate, regular rhythm and normal heart sounds.   Pulmonary/Chest: Effort normal and breath sounds normal. No respiratory distress. He has no wheezes. He has no rales. He exhibits no tenderness.  Abdominal: Soft. Bowel sounds are normal. He exhibits no distension and no mass. There is no tenderness. There is no rebound and no guarding.  Musculoskeletal: Normal range of motion.  Neurological: He is alert and oriented to person, place, and time. No cranial nerve deficit.  Normal gait   Skin: Skin is warm and dry.    ED Course  Procedures (including critical care time) COORDINATION OF CARE:  Nursing notes reviewed. Vital signs reviewed. Initial pt interview and examination performed.   Filed Vitals:   03/05/14 0951  BP: 136/74  Pulse: 85  Temp: 98.2 F (36.8 C)  TempSrc: Oral  SpO2: 100%    10:27 AM-Discussed work up plan with pt at bedside, which includes No orders of the defined types were placed in this encounter.  . Pt agrees with plan.   Treatment plan initiated:Medications - No data to display   Initial diagnostic testing ordered.       Labs Review Labs Reviewed - No data to display  Imaging Review No results found.   EKG Interpretation None      MDM   Final diagnoses:  Physical exam    Pt presenting to ED for work note stating he may return to work. Denies any physical complaints at this time. Vitals: WNL.  Pt appears medically stable to return to work. Work note provided today stating pt may return to work w/o restrictions.   I personally performed the services described in this documentation, which was scribed in my presence. The recorded information has been reviewed and is accurate.   Noland Fordyce, PA-C 03/05/14 1043

## 2014-03-05 NOTE — Discharge Instructions (Signed)
Normal Exam, Adult  You were seen and examined today in our facility. Our caregiver found nothing wrong on the exam. If testing was done such as lab work or x-rays, they did not indicate enough wrong to suggest that treatment should be given. The caregiver then must decide after testing is finished if your concern is a physical problem or illness that needs treatment. Today no treatable problem was found. Even if reassurance was given, if you feel you are getting worse when you get home make sure you call back or return to our department.  For the protection of your privacy, test results can not be given over the phone. Make sure you receive the results of your test. Ask as to how these results are to be obtained if you have not been informed. It is your responsibility to obtain your test results.  Your condition can change over time. Sometimes it takes more than one visit to determine the cause of your symptoms. It is important that you monitor your condition for any changes.  SEEK IMMEDIATE MEDICAL CARE IF:  · You develop an oral temperature above 102° F (38.9° C), which lasts for more than 2 days, not controlled by medications. Only take over-the-counter or prescription medicines for pain, discomfort, or fever as directed by your caregiver.  · You develop a loss of appetite or start throwing up (vomiting).  · You develop a rash, cough, belly (abdominal) pain, earache, headache, or develop pain in neck, muscles, or joints.  · The problem or problems which brought you to our facility become worse or are a cause of more concern.  If we have told you today your exam and tests are normal, and a short while later you feel this is not right, please seek medical attention so you may be rechecked.  Document Released: 12/17/2000 Document Revised: 11/27/2011 Document Reviewed: 04/10/2008  ExitCare® Patient Information ©2015 [employer]. This information is not intended to replace advice given to you by your health care  provider. Make sure you discuss any questions you have with your health care provider.

## 2014-03-05 NOTE — ED Notes (Signed)
Patient states no physical problems.   Patient states he just needs a note to return to work.

## 2014-03-11 ENCOUNTER — Telehealth: Payer: Self-pay

## 2014-03-11 NOTE — Telephone Encounter (Signed)
Patient needs letter for work stating his vertigo  Does not interfere with his job duties

## 2014-04-27 ENCOUNTER — Ambulatory Visit: Payer: Commercial Managed Care - PPO | Attending: Internal Medicine

## 2014-04-27 ENCOUNTER — Ambulatory Visit: Payer: Commercial Managed Care - PPO

## 2014-04-27 VITALS — BP 137/81 | HR 102 | Temp 98.3°F | Resp 16

## 2014-04-27 DIAGNOSIS — I10 Essential (primary) hypertension: Secondary | ICD-10-CM

## 2014-04-27 DIAGNOSIS — E119 Type 2 diabetes mellitus without complications: Secondary | ICD-10-CM

## 2014-04-27 DIAGNOSIS — E785 Hyperlipidemia, unspecified: Secondary | ICD-10-CM

## 2014-04-27 DIAGNOSIS — Z139 Encounter for screening, unspecified: Secondary | ICD-10-CM

## 2014-04-27 LAB — COMPLETE METABOLIC PANEL WITH GFR
ALT: 27 U/L (ref 0–53)
AST: 17 U/L (ref 0–37)
Albumin: 4.4 g/dL (ref 3.5–5.2)
Alkaline Phosphatase: 62 U/L (ref 39–117)
BUN: 10 mg/dL (ref 6–23)
CALCIUM: 10.3 mg/dL (ref 8.4–10.5)
CHLORIDE: 98 meq/L (ref 96–112)
CO2: 22 meq/L (ref 19–32)
Creat: 0.97 mg/dL (ref 0.50–1.35)
GFR, Est Non African American: >89 mL/min
Glucose, Bld: 335 mg/dL — ABNORMAL HIGH (ref 70–99)
Potassium: 3.7 mEq/L (ref 3.5–5.3)
SODIUM: 138 meq/L (ref 135–145)
Total Bilirubin: 0.7 mg/dL (ref 0.2–1.2)
Total Protein: 6.5 g/dL (ref 6.0–8.3)

## 2014-04-27 LAB — LIPID PANEL
Cholesterol: 142 mg/dL (ref 0–200)
HDL: 46 mg/dL (ref >39)
LDL CALC: 77 mg/dL (ref 0–99)
Total CHOL/HDL Ratio: 3.1 Ratio
Triglycerides: 96 mg/dL (ref <150)
VLDL: 19 mg/dL (ref 0–40)

## 2014-04-27 LAB — TSH: TSH: 1.017 u[IU]/mL (ref 0.350–4.500)

## 2014-04-27 MED ORDER — GABAPENTIN 300 MG PO CAPS: 300.0000 mg | ORAL_CAPSULE | Freq: Three times a day (TID) | ORAL | Status: DC
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
## 2014-04-28 ENCOUNTER — Telehealth: Payer: Self-pay | Admitting: Emergency Medicine

## 2014-04-28 LAB — VITAMIN D 25 HYDROXY (VIT D DEFICIENCY, FRACTURES): VIT D 25 HYDROXY: 21 ng/mL — AB (ref 30–89)

## 2014-04-28 MED ORDER — VITAMIN D (ERGOCALCIFEROL) 1.25 MG (50000 UNIT) PO CAPS: 50000.0000 [IU] | ORAL_CAPSULE | ORAL | Status: DC

## 2014-04-28 NOTE — Telephone Encounter (Signed)
Message copied by Darlis LoanSMITH, Ronasia Isola D on Tue Apr 28, 2014  4:13 PM ------      Message from: Doris CheadleADVANI, DEEPAK      Created: Tue Apr 28, 2014 12:04 PM       Blood work reviewed, noticed low vitamin D, call patient advise to start ergocalciferol 50,000 units once a week for the duration of  12 weeks.       ------

## 2014-04-28 NOTE — Telephone Encounter (Signed)
Left message for pt to call when message received 

## 2014-04-29 ENCOUNTER — Emergency Department (HOSPITAL_COMMUNITY)
Admission: EM | Admit: 2014-04-29 | Discharge: 2014-04-29 | Disposition: A | Discharge status: Home / Self Care | Payer: Commercial Managed Care - PPO | Attending: Emergency Medicine | Admitting: Emergency Medicine

## 2014-04-29 ENCOUNTER — Encounter (HOSPITAL_COMMUNITY): Payer: Self-pay | Admitting: Emergency Medicine

## 2014-04-29 DIAGNOSIS — Z79899 Other long term (current) drug therapy: Secondary | ICD-10-CM | POA: Diagnosis not present

## 2014-04-29 DIAGNOSIS — G8911 Acute pain due to trauma: Secondary | ICD-10-CM | POA: Insufficient documentation

## 2014-04-29 DIAGNOSIS — F17200 Nicotine dependence, unspecified, uncomplicated: Secondary | ICD-10-CM | POA: Insufficient documentation

## 2014-04-29 DIAGNOSIS — I10 Essential (primary) hypertension: Secondary | ICD-10-CM | POA: Diagnosis not present

## 2014-04-29 DIAGNOSIS — M25519 Pain in unspecified shoulder: Secondary | ICD-10-CM | POA: Diagnosis present

## 2014-04-29 DIAGNOSIS — E119 Type 2 diabetes mellitus without complications: Secondary | ICD-10-CM | POA: Insufficient documentation

## 2014-04-29 DIAGNOSIS — R011 Cardiac murmur, unspecified: Secondary | ICD-10-CM | POA: Diagnosis not present

## 2014-04-29 DIAGNOSIS — S46912A Strain of unspecified muscle, fascia and tendon at shoulder and upper arm level, left arm, initial encounter: Secondary | ICD-10-CM

## 2014-04-29 MED ORDER — CYCLOBENZAPRINE HCL 10 MG PO TABS: 10.0000 mg | ORAL_TABLET | Freq: Two times a day (BID) | ORAL | Status: DC | PRN

## 2014-04-29 MED ORDER — KETOROLAC TROMETHAMINE 60 MG/2ML IM SOLN
60.0000 mg | Freq: Once | INTRAMUSCULAR | Status: AC
  Administered 2014-04-29: 60 mg via INTRAMUSCULAR
  Filled 2014-04-29: qty 2

## 2014-04-29 MED ORDER — TRAMADOL HCL 50 MG PO TABS: 50.0000 mg | ORAL_TABLET | Freq: Four times a day (QID) | ORAL | Status: DC | PRN

## 2014-04-29 MED ORDER — PREDNISONE 10 MG PO TABS: ORAL_TABLET | ORAL | Status: DC

## 2014-04-29 NOTE — ED Notes (Signed)
Patient states that he went to doctor Monday with same and he was prescribed gabapentin.  Patient complains of L shoulder pain.   Patient denies injury.   Patient states that the medication received doesn't work and he want's something different.
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
## 2014-04-29 NOTE — Discharge Instructions (Signed)
Sling for comfort. Ice his shoulder. Take prednisone as prescribed until all gone for inflammation. Ultram for severe pain. Flexeril for spasms. Followup with primary care doctor.   Shoulder Pain The shoulder is the joint that connects your arms to your body. The bones that form the shoulder joint include the upper arm bone (humerus), the shoulder blade (scapula), and the collarbone (clavicle). The top of the humerus is shaped like a ball and fits into a rather flat socket on the scapula (glenoid cavity). A combination of muscles and strong, fibrous tissues that connect muscles to bones (tendons) support your shoulder joint and hold the ball in the socket. Small, fluid-filled sacs (bursae) are located in different areas of the joint. They act as cushions between the bones and the overlying soft tissues and help reduce friction between the gliding tendons and the bone as you move your arm. Your shoulder joint allows a wide range of motion in your arm. This range of motion allows you to do things like scratch your back or throw a ball. However, this range of motion also makes your shoulder more prone to pain from overuse and injury. Causes of shoulder pain can originate from both injury and overuse and usually can be grouped in the following four categories:  Redness, swelling, and pain (inflammation) of the tendon (tendinitis) or the bursae (bursitis).  Instability, such as a dislocation of the joint.  Inflammation of the joint (arthritis).  Broken bone (fracture). HOME CARE INSTRUCTIONS   Apply ice to the sore area.  Put ice in a plastic bag.  Place a towel between your skin and the bag.  Leave the ice on for 15-20 minutes, 3-4 times per day for the first 2 days, or as directed by your health care provider.  Stop using cold packs if they do not help with the pain.  If you have a shoulder sling or immobilizer, wear it as long as your caregiver instructs. Only remove it to shower or bathe.  Move your arm as little as possible, but keep your hand moving to prevent swelling.  Squeeze a soft ball or foam pad as much as possible to help prevent swelling.  Only take over-the-counter or prescription medicines for pain, discomfort, or fever as directed by your caregiver. SEEK MEDICAL CARE IF:   Your shoulder pain increases, or new pain develops in your arm, hand, or fingers.  Your hand or fingers become cold and numb.  Your pain is not relieved with medicines. SEEK IMMEDIATE MEDICAL CARE IF:   Your arm, hand, or fingers are numb or tingling.  Your arm, hand, or fingers are significantly swollen or turn white or blue. MAKE SURE YOU:   Understand these instructions.  Will watch your condition.  Will get help right away if you are not doing well or get worse. Document Released: 06/14/2005 Document Revised: 01/19/2014 Document Reviewed: 08/19/2011 [facility] SurgeryExitCare Patient Information 2015 [employer], [place]. This information is not intended to replace advice given to you by your health care provider. Make sure you discuss any questions you have with your health care provider.

## 2014-04-29 NOTE — ED Notes (Signed)
Pt is a long distance truck driver. Woke 3 days ago with left neck and shoulder pain with radiation to mid-upper arm. States has tingling in 3-5th fingers of left hand.

## 2014-04-30 NOTE — ED Provider Notes (Signed)
Medical screening examination/treatment/procedure(s) were performed by non-physician practitioner and as supervising physician I was immediately available for consultation/collaboration.   EKG Interpretation None        Samuel JesterKathleen Tionna Gigante, DO 04/30/14 1711

## 2014-05-04 ENCOUNTER — Ambulatory Visit: Payer: Commercial Managed Care - PPO | Admitting: Internal Medicine

## 2014-09-07 ENCOUNTER — Ambulatory Visit (HOSPITAL_BASED_OUTPATIENT_CLINIC_OR_DEPARTMENT_OTHER): Payer: Commercial Managed Care - PPO

## 2014-09-07 ENCOUNTER — Ambulatory Visit: Payer: Commercial Managed Care - PPO | Attending: Internal Medicine | Admitting: Internal Medicine

## 2014-09-07 ENCOUNTER — Encounter: Payer: Self-pay | Admitting: Internal Medicine

## 2014-09-07 VITALS — BP 150/80 | HR 72 | Temp 98.0°F | Resp 16 | Wt 155.8 lb

## 2014-09-07 DIAGNOSIS — F17200 Nicotine dependence, unspecified, uncomplicated: Secondary | ICD-10-CM

## 2014-09-07 DIAGNOSIS — Z720 Tobacco use: Secondary | ICD-10-CM

## 2014-09-07 DIAGNOSIS — M549 Dorsalgia, unspecified: Secondary | ICD-10-CM | POA: Insufficient documentation

## 2014-09-07 DIAGNOSIS — Z23 Encounter for immunization: Secondary | ICD-10-CM | POA: Insufficient documentation

## 2014-09-07 DIAGNOSIS — E139 Other specified diabetes mellitus without complications: Secondary | ICD-10-CM

## 2014-09-07 DIAGNOSIS — R011 Cardiac murmur, unspecified: Secondary | ICD-10-CM | POA: Diagnosis not present

## 2014-09-07 DIAGNOSIS — I10 Essential (primary) hypertension: Secondary | ICD-10-CM | POA: Diagnosis not present

## 2014-09-07 DIAGNOSIS — E119 Type 2 diabetes mellitus without complications: Secondary | ICD-10-CM | POA: Insufficient documentation

## 2014-09-07 DIAGNOSIS — R319 Hematuria, unspecified: Secondary | ICD-10-CM | POA: Insufficient documentation

## 2014-09-07 DIAGNOSIS — R21 Rash and other nonspecific skin eruption: Secondary | ICD-10-CM | POA: Insufficient documentation

## 2014-09-07 DIAGNOSIS — Z79899 Other long term (current) drug therapy: Secondary | ICD-10-CM | POA: Diagnosis not present

## 2014-09-07 DIAGNOSIS — F17210 Nicotine dependence, cigarettes, uncomplicated: Secondary | ICD-10-CM | POA: Diagnosis not present

## 2014-09-07 DIAGNOSIS — E785 Hyperlipidemia, unspecified: Secondary | ICD-10-CM | POA: Diagnosis not present

## 2014-09-07 LAB — POCT URINALYSIS DIPSTICK
Bilirubin, UA: NEGATIVE
Glucose, UA: 500
Ketones, UA: NEGATIVE
Leukocytes, UA: NEGATIVE
Nitrite, UA: NEGATIVE
PH UA: 5
Protein, UA: NEGATIVE
RBC UA: NEGATIVE
Spec Grav, UA: 1.01
UROBILINOGEN UA: 0.2

## 2014-09-07 LAB — GLUCOSE, POCT (MANUAL RESULT ENTRY): POC Glucose: 320 mg/dl — AB (ref 70–99)

## 2014-09-07 LAB — POCT GLYCOSYLATED HEMOGLOBIN (HGB A1C): HEMOGLOBIN A1C: 10.8

## 2014-09-07 MED ORDER — HYDROCORTISONE 2.5 % EX CREA: TOPICAL_CREAM | Freq: Two times a day (BID) | CUTANEOUS | Status: DC

## 2014-09-07 MED ORDER — LISINOPRIL-HYDROCHLOROTHIAZIDE 10-12.5 MG PO TABS: 1.0000 | ORAL_TABLET | Freq: Every day | ORAL | Status: DC

## 2014-09-07 MED ORDER — METFORMIN HCL ER (OSM) 1000 MG PO TB24: 1000.0000 mg | ORAL_TABLET | Freq: Two times a day (BID) | ORAL | Status: DC

## 2014-09-07 MED ORDER — IBUPROFEN 600 MG PO TABS: 600.0000 mg | ORAL_TABLET | Freq: Three times a day (TID) | ORAL | Status: DC | PRN

## 2014-09-07 MED ORDER — INSULIN ASPART 100 UNIT/ML ~~LOC~~ SOLN
10.0000 [IU] | Freq: Once | SUBCUTANEOUS | Status: AC
  Administered 2014-09-07: 10 [IU] via SUBCUTANEOUS

## 2014-09-07 MED ORDER — GLIPIZIDE 10 MG PO TABS: 10.0000 mg | ORAL_TABLET | Freq: Two times a day (BID) | ORAL | Status: DC

## 2014-09-07 NOTE — Progress Notes (Signed)
Patient is here for follow up Patient states hurt his back about three weeks ago Did have some blood in his urine but has since subsided Patient also has a rash to his right leg and requesting a prescription for some cream

## 2014-09-07 NOTE — Progress Notes (Signed)
MRN: [id number] Name: Gregory Michael  Sex: male Age: 48 y.o. DOB: Feb 13, 1966  Allergies: Insulin detemir  Chief Complaint  Patient presents with  . Back Pain    HPI: Patient is 48 y.o. male who has to of diabetes hypertension hyperlipidemia comes today for followup as per patient he has missed the dosage of blood pressure medication for a few days and again started taking, his blood pressure initially was elevated but repeat manual blood pressure is 150/80, his blood sugar is elevated as per patient he also noticed some blood in his urine, today UA shows no blood but has glucose in urine, is given insulin subsequent blood sugar improved, his hemoglobin A1c has trended up compared to last visit currently taking metformin 500 twice a day and sometimes he skips the dosages, also he ran out of Glucotrol for the last one month, he also reported to have rash on his right leg, he has not tried any over-the-counter medication but does recall there was a change in his detergent recently which could have contributed to the symptoms  Past Medical History  Diagnosis Date  . Diabetes mellitus   . Heart murmur   . Hypertension     History reviewed. No pertinent past surgical history.    Medication List       This list is accurate as of: 09/07/14  1:13 PM.  Always use your most recent med list.               cyclobenzaprine 10 MG tablet  Commonly known as:  FLEXERIL  Take 1 tablet (10 mg total) by mouth at bedtime.     cyclobenzaprine 10 MG tablet  Commonly known as:  FLEXERIL  Take 1 tablet (10 mg total) by mouth 2 (two) times daily as needed for muscle spasms.     diphenoxylate-atropine 2.5-0.025 MG per tablet  Commonly known as:  LOMOTIL  Take 1 tablet by mouth 3 (three) times daily before meals.     gabapentin 300 MG capsule  Commonly known as:  NEURONTIN  Take 1 capsule (300 mg total) by mouth 3 (three) times daily.     glipiZIDE 10 MG tablet  Commonly known as:  GLUCOTROL   Take 1 tablet (10 mg total) by mouth 2 (two) times daily before a meal.     glucose blood test strip  Commonly known as:  RELION CONFIRM/MICRO TEST  Use as instructed     hydrocortisone 2.5 % cream  Apply topically 2 (two) times daily.     ibuprofen 600 MG tablet  Commonly known as:  ADVIL,MOTRIN  Take 1 tablet (600 mg total) by mouth every 8 (eight) hours as needed.     lisinopril-hydrochlorothiazide 10-12.5 MG per tablet  Commonly known as:  PRINZIDE,ZESTORETIC  Take 1 tablet by mouth daily.     meclizine 25 MG tablet  Commonly known as:  ANTIVERT  Take 1 tablet (25 mg total) by mouth 3 (three) times daily as needed for dizziness.     metformin 1000 MG (OSM) 24 hr tablet  Commonly known as:  GLUCOPHAGE XR  Take 1 tablet (1,000 mg total) by mouth 2 (two) times daily with a meal.     nicotine polacrilex 4 MG gum  Commonly known as:  NICORETTE  Take 1 each (4 mg total) by mouth as needed for smoking cessation.     pravastatin 40 MG tablet  Commonly known as:  PRAVACHOL  Take 1 tablet (40 mg total) by  mouth daily.     predniSONE 10 MG tablet  Commonly known as:  DELTASONE  Take 5 tab day 1, take 4 tab day 2, take 3 tab day 3, take 2 tab day 4, and take 1 tab day 5     RELION CONFIRM GLUCOSE MONITOR W/DEVICE Kit  1 Units by Does not apply route as directed.     sildenafil 100 MG tablet  Commonly known as:  VIAGRA  Take 0.5-1 tablets (50-100 mg total) by mouth daily as needed for erectile dysfunction.     traMADol 50 MG tablet  Commonly known as:  ULTRAM  Take 1 tablet (50 mg total) by mouth every 6 (six) hours as needed.     Vitamin D (Ergocalciferol) 50000 UNITS Caps capsule  Commonly known as:  DRISDOL  Take 1 capsule (50,000 Units total) by mouth every 7 (seven) days.        Meds ordered this encounter  Medications  . insulin aspart (novoLOG) injection 10 Units    Sig:   . glipiZIDE (GLUCOTROL) 10 MG tablet    Sig: Take 1 tablet (10 mg total) by mouth 2  (two) times daily before a meal.    Dispense:  60 tablet    Refill:  3  . lisinopril-hydrochlorothiazide (PRINZIDE,ZESTORETIC) 10-12.5 MG per tablet    Sig: Take 1 tablet by mouth daily.    Dispense:  30 tablet    Refill:  3  . metformin (FORTAMET) 1000 MG (OSM) 24 hr tablet    Sig: Take 1 tablet (1,000 mg total) by mouth 2 (two) times daily with a meal.    Dispense:  60 tablet    Refill:  3  . hydrocortisone 2.5 % cream    Sig: Apply topically 2 (two) times daily.    Dispense:  30 g    Refill:  0  . ibuprofen (ADVIL,MOTRIN) 600 MG tablet    Sig: Take 1 tablet (600 mg total) by mouth every 8 (eight) hours as needed.    Dispense:  30 tablet    Refill:  0    Immunization History  Administered Date(s) Administered  . Influenza Split 08/01/2013  . Influenza,inj,Quad PF,36+ Mos 09/07/2014    History reviewed. No pertinent family history.  History  Substance Use Topics  . Smoking status: Current Every Day Smoker -- 0.50 packs/day    Types: Cigarettes  . Smokeless tobacco: Not on file  . Alcohol Use: Yes     Comment: rare    Review of Systems   As noted in HPI  Filed Vitals:   09/07/14 1243  BP: 150/80  Pulse:   Temp:   Resp:     Physical Exam  Physical Exam  Constitutional: No distress.  Eyes: EOM are normal. Pupils are equal, round, and reactive to light.  Neck: Neck supple.  Cardiovascular: Normal rate and regular rhythm.   Pulmonary/Chest: Breath sounds normal. No respiratory distress. He has no wheezes. He has no rales.  Musculoskeletal: He exhibits no edema.  Skin:  Rash right leg     CBC    Component Value Date/Time   WBC 8.6 12/24/2012 1441   RBC 5.29 12/24/2012 1441   HGB 16.9 12/24/2012 1441   HCT 45.8 12/24/2012 1441   PLT 217 12/24/2012 1441   MCV 86.6 12/24/2012 1441   LYMPHSABS 2.4 10/21/2007 0330   MONOABS 0.9 10/21/2007 0330   EOSABS 0.1 10/21/2007 0330   BASOSABS 0.1 10/21/2007 0330    CMP  Component Value Date/Time   NA  138 04/27/2014 0905   K 3.7 04/27/2014 0905   CL 98 04/27/2014 0905   CO2 22 04/27/2014 0905   GLUCOSE 335* 04/27/2014 0905   BUN 10 04/27/2014 0905   CREATININE 0.97 04/27/2014 0905   CREATININE 0.78 12/24/2012 1441   CALCIUM 10.3 04/27/2014 0905   PROT 6.5 04/27/2014 0905   ALBUMIN 4.4 04/27/2014 0905   AST 17 04/27/2014 0905   ALT 27 04/27/2014 0905   ALKPHOS 62 04/27/2014 0905   BILITOT 0.7 04/27/2014 0905   GFRNONAA >89 04/27/2014 0905   GFRNONAA >90 12/24/2012 1441   GFRAA >89 04/27/2014 0905   GFRAA >90 12/24/2012 1441    Lab Results  Component Value Date/Time   CHOL 142 04/27/2014 09:05 AM    No components found for: HGA1C  Lab Results  Component Value Date/Time   AST 17 04/27/2014 09:05 AM    Assessment and Plan  Other specified diabetes mellitus without complications - Plan: diabetes is uncontrolled his hemoglobin A1c has trended up, I have increased the dose of metformin, resume back on Glucotrol, advise patient to keep the fingerstick log, advise for diabetes meal planning, will repeat A1c in 3 months., Urinalysis Dipstick, insulin aspart (novoLOG) injection 10 Units, Glucose (CBG)  Blood in urine - Plan: Results for orders placed or performed in visit on 09/07/14  Glucose (CBG)  Result Value Ref Range   POC Glucose 320 (A) 70 - 99 mg/dl  HgB A1c  Result Value Ref Range   Hemoglobin A1C 10.8   Urinalysis Dipstick  Result Value Ref Range   Color, UA yellow    Clarity, UA clear    Glucose, UA 500    Bilirubin, UA neg    Ketones, UA neg    Spec Grav, UA 1.010    Blood, UA neg    pH, UA 5.0    Protein, UA neg    Urobilinogen, UA 0.2    Nitrite, UA neg    Leukocytes, UA Negative     Urinalysis Dipstick is negative for blood is glucose urine  Essential hypertension - Plan: lisinopril-hydrochlorothiazide (PRINZIDE,ZESTORETIC) 10-12.5 MG per tablet  Hyperlipidemia Patient  used to be on Pravachol, resume back on the medication will repeat fasting  lipid panel on the next visit.  Smoking Advised patient to quit smoking.  Needs flu shot Flu shot given today.  Rash and nonspecific skin eruption Advise patient consider changing  his detergent, also prescribed hydrocortisone cream.  Health Maintenance  -Vaccinations:  Flu shot today   Return in about 3 months (around 12/07/2014) for diabetes, hypertension, hyperipidemia.  Lorayne Marek, MD

## 2014-09-07 NOTE — Patient Instructions (Signed)
Diabetes Mellitus and Food It is important for you to manage your blood sugar (glucose) level. Your blood glucose level can be greatly affected by what you eat. Eating healthier foods in the appropriate amounts throughout the day at about the same time each day will help you control your blood glucose level. It can also help slow or prevent worsening of your diabetes mellitus. Healthy eating may even help you improve the level of your blood pressure and reach or maintain a healthy weight.  HOW CAN FOOD AFFECT ME? Carbohydrates Carbohydrates affect your blood glucose level more than any other type of food. Your dietitian will help you determine how many carbohydrates to eat at each meal and teach you how to count carbohydrates. Counting carbohydrates is important to keep your blood glucose at a healthy level, especially if you are using insulin or taking certain medicines for diabetes mellitus. Alcohol Alcohol can cause sudden decreases in blood glucose (hypoglycemia), especially if you use insulin or take certain medicines for diabetes mellitus. Hypoglycemia can be a life-threatening condition. Symptoms of hypoglycemia (sleepiness, dizziness, and disorientation) are similar to symptoms of having too much alcohol.  If your health care provider has given you approval to drink alcohol, do so in moderation and use the following guidelines:  Women should not have more than one drink per day, and men should not have more than two drinks per day. One drink is equal to:  12 oz of beer.  5 oz of wine.  1 oz of hard liquor.  Do not drink on an empty stomach.  Keep yourself hydrated. Have water, diet soda, or unsweetened iced tea.  Regular soda, juice, and other mixers might contain a lot of carbohydrates and should be counted. WHAT FOODS ARE NOT RECOMMENDED? As you make food choices, it is important to remember that all foods are not the same. Some foods have fewer nutrients per serving than other  foods, even though they might have the same number of calories or carbohydrates. It is difficult to get your body what it needs when you eat foods with fewer nutrients. Examples of foods that you should avoid that are high in calories and carbohydrates but low in nutrients include:  Trans fats (most processed foods list trans fats on the Nutrition Facts label).  Regular soda.  Juice.  Candy.  Sweets, such as cake, pie, doughnuts, and cookies.  Fried foods. WHAT FOODS CAN I EAT? Have nutrient-rich foods, which will nourish your body and keep you healthy. The food you should eat also will depend on several factors, including:  The calories you need.  The medicines you take.  Your weight.  Your blood glucose level.  Your blood pressure level.  Your cholesterol level. You also should eat a variety of foods, including:  Protein, such as meat, poultry, fish, tofu, nuts, and seeds (lean animal proteins are best).  Fruits.  Vegetables.  Dairy products, such as milk, cheese, and yogurt (low fat is best).  Breads, grains, pasta, cereal, rice, and beans.  Fats such as olive oil, trans fat-free margarine, canola oil, avocado, and olives. DOES EVERYONE WITH DIABETES MELLITUS HAVE THE SAME MEAL PLAN? Because every person with diabetes mellitus is different, there is not one meal plan that works for everyone. It is very important that you meet with a dietitian who will help you create a meal plan that is just right for you. Document Released: 06/01/2005 Document Revised: 09/09/2013 Document Reviewed: 08/01/2013 ExitCare Patient Information 2015 [employer]. This   information is not intended to replace advice given to you by your health care provider. Make sure you discuss any questions you have with your health care provider. DASH Eating Plan DASH stands for "Dietary Approaches to Stop Hypertension." The DASH eating plan is a healthy eating plan that has been shown to reduce high  blood pressure (hypertension). Additional health benefits may include reducing the risk of type 2 diabetes mellitus, heart disease, and stroke. The DASH eating plan may also help with weight loss. WHAT DO I NEED TO KNOW ABOUT THE DASH EATING PLAN? For the DASH eating plan, you will follow these general guidelines:  Choose foods with a percent daily value for sodium of less than 5% (as listed on the food label).  Use salt-free seasonings or herbs instead of table salt or sea salt.  Check with your health care provider or pharmacist before using salt substitutes.  Eat lower-sodium products, often labeled as "lower sodium" or "no salt added."  Eat fresh foods.  Eat more vegetables, fruits, and low-fat dairy products.  Choose whole grains. Look for the word "whole" as the first word in the ingredient list.  Choose fish and skinless chicken or turkey more often than red meat. Limit fish, poultry, and meat to 6 oz (170 g) each day.  Limit sweets, desserts, sugars, and sugary drinks.  Choose heart-healthy fats.  Limit cheese to 1 oz (28 g) per day.  Eat more home-cooked food and less restaurant, buffet, and fast food.  Limit fried foods.  Cook foods using methods other than frying.  Limit canned vegetables. If you do use them, rinse them well to decrease the sodium.  When eating at a restaurant, ask that your food be prepared with less salt, or no salt if possible. WHAT FOODS CAN I EAT? Seek help from a dietitian for individual calorie needs. Grains Whole grain or whole wheat bread. Brown rice. Whole grain or whole wheat pasta. Quinoa, bulgur, and whole grain cereals. Low-sodium cereals. Corn or whole wheat flour tortillas. Whole grain cornbread. Whole grain crackers. Low-sodium crackers. Vegetables Fresh or frozen vegetables (raw, steamed, roasted, or grilled). Low-sodium or reduced-sodium tomato and vegetable juices. Low-sodium or reduced-sodium tomato sauce and paste. Low-sodium  or reduced-sodium canned vegetables.  Fruits All fresh, canned (in natural juice), or frozen fruits. Meat and Other Protein Products Ground beef (85% or leaner), grass-fed beef, or beef trimmed of fat. Skinless chicken or turkey. Ground chicken or turkey. Pork trimmed of fat. All fish and seafood. Eggs. Dried beans, peas, or lentils. Unsalted nuts and seeds. Unsalted canned beans. Dairy Low-fat dairy products, such as skim or 1% milk, 2% or reduced-fat cheeses, low-fat ricotta or cottage cheese, or plain low-fat yogurt. Low-sodium or reduced-sodium cheeses. Fats and Oils Tub margarines without trans fats. Light or reduced-fat mayonnaise and salad dressings (reduced sodium). Avocado. Safflower, olive, or canola oils. Natural peanut or almond butter. Other Unsalted popcorn and pretzels. The items listed above may not be a complete list of recommended foods or beverages. Contact your dietitian for more options. WHAT FOODS ARE NOT RECOMMENDED? Grains White bread. White pasta. White rice. Refined cornbread. Bagels and croissants. Crackers that contain trans fat. Vegetables Creamed or fried vegetables. Vegetables in a cheese sauce. Regular canned vegetables. Regular canned tomato sauce and paste. Regular tomato and vegetable juices. Fruits Dried fruits. Canned fruit in light or heavy syrup. Fruit juice. Meat and Other Protein Products Fatty cuts of meat. Ribs, chicken wings, bacon, sausage, bologna, salami, chitterlings, fatback, hot   dogs, bratwurst, and packaged luncheon meats. Salted nuts and seeds. Canned beans with salt. Dairy Whole or 2% milk, cream, half-and-half, and cream cheese. Whole-fat or sweetened yogurt. Full-fat cheeses or blue cheese. Nondairy creamers and whipped toppings. Processed cheese, cheese spreads, or cheese curds. Condiments Onion and garlic salt, seasoned salt, table salt, and sea salt. Canned and packaged gravies. Worcestershire sauce. Tartar sauce. Barbecue sauce.  Teriyaki sauce. Soy sauce, including reduced sodium. Steak sauce. Fish sauce. Oyster sauce. Cocktail sauce. Horseradish. Ketchup and mustard. Meat flavorings and tenderizers. Bouillon cubes. Hot sauce. Tabasco sauce. Marinades. Taco seasonings. Relishes. Fats and Oils Butter, stick margarine, lard, shortening, ghee, and bacon fat. Coconut, palm kernel, or palm oils. Regular salad dressings. Other Pickles and olives. Salted popcorn and pretzels. The items listed above may not be a complete list of foods and beverages to avoid. Contact your dietitian for more information. WHERE CAN I FIND MORE INFORMATION? National Heart, [employer], and [employer] Institute: www.nhlbi.nih.gov/health/health-topics/topics/dash/ Document Released: 08/24/2011 Document Revised: 01/19/2014 Document Reviewed: 07/09/2013 ExitCare Patient Information 2015 [employer]. This information is not intended to replace advice given to you by your health care provider. Make sure you discuss any questions you have with your health care provider.  

## 2014-10-05 ENCOUNTER — Other Ambulatory Visit: Payer: Self-pay | Admitting: Internal Medicine

## 2014-11-15 ENCOUNTER — Other Ambulatory Visit: Payer: Self-pay | Admitting: Internal Medicine

## 2014-11-16 ENCOUNTER — Other Ambulatory Visit: Payer: Self-pay | Admitting: Emergency Medicine

## 2014-11-16 MED ORDER — METFORMIN HCL 1000 MG PO TABS: 1000.0000 mg | ORAL_TABLET | Freq: Two times a day (BID) | ORAL | Status: DC

## 2014-12-03 ENCOUNTER — Encounter (HOSPITAL_COMMUNITY): Payer: Self-pay | Admitting: Emergency Medicine

## 2014-12-03 ENCOUNTER — Emergency Department (INDEPENDENT_AMBULATORY_CARE_PROVIDER_SITE_OTHER)
Admission: EM | Admit: 2014-12-03 | Discharge: 2014-12-03 | Disposition: A | Discharge status: Home / Self Care | Payer: Commercial Managed Care - PPO | Source: Home / Self Care | Attending: Family Medicine | Admitting: Family Medicine

## 2014-12-03 DIAGNOSIS — K0889 Other specified disorders of teeth and supporting structures: Secondary | ICD-10-CM

## 2014-12-03 DIAGNOSIS — K088 Other specified disorders of teeth and supporting structures: Secondary | ICD-10-CM | POA: Diagnosis not present

## 2014-12-03 MED ORDER — DICLOFENAC SODIUM 75 MG PO TBEC: 75.0000 mg | DELAYED_RELEASE_TABLET | Freq: Two times a day (BID) | ORAL | Status: DC | PRN

## 2014-12-03 MED ORDER — AMOXICILLIN 500 MG PO CAPS: 500.0000 mg | ORAL_CAPSULE | Freq: Three times a day (TID) | ORAL | Status: DC

## 2014-12-03 NOTE — ED Notes (Signed)
C/o right upper side dental pain onset 1 week Denies fevers, chills Alert, no signs of acute distress

## 2014-12-03 NOTE — Discharge Instructions (Signed)
Thank you for coming in today. °[facility]: ° °[facility] - [phone] (ext [phone]) ° °[address] ° °Please call Dr. Civils office [phone] or cell [phone] °[address], [place]  °Cost for tooth removal $200 includes exam, Xray, and extraction and follow up visit.  °Bring list of current medications with you.  ° °[facility] - [phone] ° °[facility] - [phone] ° °[address] ° °[facility] ° °[address], [place], [place], [address] ° °[phone], Ext. [phone] ° °2nd and 4th Thursday of the month at 6:30am (Simple extractions only - no wisdom teeth or surgery) First come/First serve -First 10 clients served ° °[facility] ([place] residents only) ° °[address], [place], [place], [address] ° °[phone] ° °[facility] ° °[phone] ° °[facility] ° °[phone] ° °[facility] ° °[phone] ° °Please call [facility] at [phone] to get the details to get your tooth pulled.  ° ° ° °Dental Pain °A tooth ache may be caused by cavities (tooth decay). Cavities expose the nerve of the tooth to air and hot or cold temperatures. It may come from an infection or abscess (also called a boil or furuncle) around your tooth. It is also often caused by dental caries (tooth decay). This causes the pain you are having. °DIAGNOSIS  °Your caregiver can diagnose this problem by exam. °TREATMENT  °· If caused by an infection, it may be treated with medications which kill germs (antibiotics) and pain medications as prescribed by your caregiver. Take medications as directed. °· Only take over-the-counter or prescription medicines for pain, discomfort, or fever as directed by your caregiver. °· Whether the tooth ache today is caused by infection or dental disease, you should see your dentist as soon as  possible for further care. °SEEK MEDICAL CARE IF: °The exam and treatment you received today has been provided on an emergency basis only. This is not a substitute for complete medical or dental care. If your problem worsens or new problems (symptoms) appear, and you are unable to meet with your dentist, call or return to this location. °SEEK IMMEDIATE MEDICAL CARE IF:  °· You have a fever. °· You develop redness and swelling of your face, jaw, or neck. °· You are unable to open your mouth. °· You have severe pain uncontrolled by pain medicine. °MAKE SURE YOU:  °· Understand these instructions. °· Will watch your condition. °· Will get help right away if you are not doing well or get worse. °Document Released: 09/04/2005 Document Revised: 11/27/2011 Document Reviewed: 04/22/2008 °ExitCare® Patient Information ©2015 [employer]. This information is not intended to replace advice given to you by your health care provider. Make sure you discuss any questions you have with your health care provider. ° °

## 2014-12-03 NOTE — ED Provider Notes (Signed)
Gregory Michael is a 49 y.o. male who presents to Urgent Care today for tooth pain. Patient has a one-week history of right upper tooth pain. Patient denies any injury fevers chills nausea vomiting or diarrhea. He notes a mild headache. He has not tried any medications yet. He has history of poor dentition.   Past Medical History  Diagnosis Date  . Diabetes mellitus   . Heart murmur   . Hypertension    History reviewed. No pertinent past surgical history. History  Substance Use Topics  . Smoking status: Current Every Day Smoker -- 0.50 packs/day    Types: Cigarettes  . Smokeless tobacco: Not on file  . Alcohol Use: Yes     Comment: rare   ROS as above Medications: No current facility-administered medications for this encounter.   Current Outpatient Prescriptions  Medication Sig Dispense Refill  . lisinopril-hydrochlorothiazide (PRINZIDE,ZESTORETIC) 10-12.5 MG per tablet Take 1 tablet by mouth daily. 30 tablet 3  . metFORMIN (GLUCOPHAGE) 1000 MG tablet Take 1 tablet (1,000 mg total) by mouth 2 (two) times daily with a meal. 180 tablet 3  . amoxicillin (AMOXIL) 500 MG capsule Take 1 capsule (500 mg total) by mouth 3 (three) times daily. 21 capsule 0  . Blood Glucose Monitoring Suppl (RELION CONFIRM GLUCOSE MONITOR) W/DEVICE KIT 1 Units by Does not apply route as directed. 1 kit 1  . cyclobenzaprine (FLEXERIL) 10 MG tablet Take 1 tablet (10 mg total) by mouth 2 (two) times daily as needed for muscle spasms. 20 tablet 0  . diclofenac (VOLTAREN) 75 MG EC tablet Take 1 tablet (75 mg total) by mouth 2 (two) times daily as needed. 30 tablet 0  . diphenoxylate-atropine (LOMOTIL) 2.5-0.025 MG per tablet Take 1 tablet by mouth 3 (three) times daily before meals. 30 tablet 6  . gabapentin (NEURONTIN) 300 MG capsule Take 1 capsule (300 mg total) by mouth 3 (three) times daily. 30 capsule 0  . glipiZIDE (GLUCOTROL) 10 MG tablet Take 1 tablet (10 mg total) by mouth 2 (two) times daily before a meal.  60 tablet 3  . glucose blood (RELION CONFIRM/MICRO TEST) test strip Use as instructed 100 each 12  . hydrocortisone 2.5 % cream Apply topically 2 (two) times daily. 30 g 0  . ibuprofen (ADVIL,MOTRIN) 600 MG tablet Take 1 tablet (600 mg total) by mouth every 8 (eight) hours as needed. 30 tablet 0  . meclizine (ANTIVERT) 25 MG tablet Take 1 tablet (25 mg total) by mouth 3 (three) times daily as needed for dizziness. 30 tablet 0  . nicotine polacrilex (NICORETTE) 4 MG gum Take 1 each (4 mg total) by mouth as needed for smoking cessation. 100 tablet 0  . pravastatin (PRAVACHOL) 40 MG tablet Take 1 tablet (40 mg total) by mouth daily. 30 tablet 3  . predniSONE (DELTASONE) 10 MG tablet Take 5 tab day 1, take 4 tab day 2, take 3 tab day 3, take 2 tab day 4, and take 1 tab day 5 15 tablet 0  . sildenafil (VIAGRA) 100 MG tablet Take 0.5-1 tablets (50-100 mg total) by mouth daily as needed for erectile dysfunction. 5 tablet 5  . traMADol (ULTRAM) 50 MG tablet Take 1 tablet (50 mg total) by mouth every 6 (six) hours as needed. 15 tablet 0  . Vitamin D, Ergocalciferol, (DRISDOL) 50000 UNITS CAPS capsule Take 1 capsule (50,000 Units total) by mouth every 7 (seven) days. 12 capsule 0   Allergies  Allergen Reactions  . Insulin Detemir  REACTION: Rash     Exam:  BP 130/78 mmHg  Pulse 88  Temp(Src) 98.2 F (36.8 C) (Oral)  Resp 16  SpO2 100% Gen: Well NAD HEENT: EOMI,  MMM poor dentition with multiple broken teeth. Right upper premolar tender to touch. No gumline erythema noted. Face is nontender. Lungs: Normal work of breathing. CTABL Heart: RRR no MRG Abd: NABS, Soft. Nondistended, Nontender Exts: Brisk capillary refill, warm and well perfused.   No results found for this or any previous visit (from the past 24 hour(s)). No results found.  Assessment and Plan: 49 y.o. male with dental pain. Treat with amoxicillin and diclofenac. Follow-up with dentist.  Discussed warning signs or symptoms.  Please see discharge instructions. Patient expresses understanding.     [name], MD 12/03/14 [phone]

## 2014-12-21 ENCOUNTER — Ambulatory Visit: Payer: Commercial Managed Care - PPO | Admitting: Internal Medicine

## 2015-01-07 ENCOUNTER — Ambulatory Visit: Payer: Commercial Managed Care - PPO | Attending: Internal Medicine | Admitting: Internal Medicine

## 2015-01-07 ENCOUNTER — Encounter: Payer: Self-pay | Admitting: Internal Medicine

## 2015-01-07 VITALS — BP 130/80 | HR 100 | Temp 98.0°F | Resp 16 | Wt 160.0 lb

## 2015-01-07 DIAGNOSIS — E139 Other specified diabetes mellitus without complications: Secondary | ICD-10-CM | POA: Diagnosis not present

## 2015-01-07 DIAGNOSIS — F17200 Nicotine dependence, unspecified, uncomplicated: Secondary | ICD-10-CM

## 2015-01-07 DIAGNOSIS — I10 Essential (primary) hypertension: Secondary | ICD-10-CM

## 2015-01-07 DIAGNOSIS — Z720 Tobacco use: Secondary | ICD-10-CM

## 2015-01-07 LAB — GLUCOSE, POCT (MANUAL RESULT ENTRY): POC Glucose: 213 mg/dl — AB (ref 70–99)

## 2015-01-07 LAB — POCT GLYCOSYLATED HEMOGLOBIN (HGB A1C): HEMOGLOBIN A1C: 7.9

## 2015-01-07 NOTE — Patient Instructions (Addendum)
DASH Eating Plan DASH stands for "Dietary Approaches to Stop Hypertension." The DASH eating plan is a healthy eating plan that has been shown to reduce high blood pressure (hypertension). Additional health benefits may include reducing the risk of type 2 diabetes mellitus, heart disease, and stroke. The DASH eating plan may also help with weight loss. WHAT DO I NEED TO KNOW ABOUT THE DASH EATING PLAN? For the DASH eating plan, you will follow these general guidelines:  Choose foods with a percent daily value for sodium of less than 5% (as listed on the food label).  Use salt-free seasonings or herbs instead of table salt or sea salt.  Check with your health care provider or pharmacist before using salt substitutes.  Eat lower-sodium products, often labeled as "lower sodium" or "no salt added."  Eat fresh foods.  Eat more vegetables, fruits, and low-fat dairy products.  Choose whole grains. Look for the word "whole" as the first word in the ingredient list.  Choose fish and skinless chicken or turkey more often than red meat. Limit fish, poultry, and meat to 6 oz (170 g) each day.  Limit sweets, desserts, sugars, and sugary drinks.  Choose heart-healthy fats.  Limit cheese to 1 oz (28 g) per day.  Eat more home-cooked food and less restaurant, buffet, and fast food.  Limit fried foods.  Cook foods using methods other than frying.  Limit canned vegetables. If you do use them, rinse them well to decrease the sodium.  When eating at a restaurant, ask that your food be prepared with less salt, or no salt if possible. WHAT FOODS CAN I EAT? Seek help from a dietitian for individual calorie needs. Grains Whole grain or whole wheat bread. Brown rice. Whole grain or whole wheat pasta. Quinoa, bulgur, and whole grain cereals. Low-sodium cereals. Corn or whole wheat flour tortillas. Whole grain cornbread. Whole grain crackers. Low-sodium crackers. Vegetables Fresh or frozen vegetables  (raw, steamed, roasted, or grilled). Low-sodium or reduced-sodium tomato and vegetable juices. Low-sodium or reduced-sodium tomato sauce and paste. Low-sodium or reduced-sodium canned vegetables.  Fruits All fresh, canned (in natural juice), or frozen fruits. Meat and Other Protein Products Ground beef (85% or leaner), grass-fed beef, or beef trimmed of fat. Skinless chicken or turkey. Ground chicken or turkey. Pork trimmed of fat. All fish and seafood. Eggs. Dried beans, peas, or lentils. Unsalted nuts and seeds. Unsalted canned beans. Dairy Low-fat dairy products, such as skim or 1% milk, 2% or reduced-fat cheeses, low-fat ricotta or cottage cheese, or plain low-fat yogurt. Low-sodium or reduced-sodium cheeses. Fats and Oils Tub margarines without trans fats. Light or reduced-fat mayonnaise and salad dressings (reduced sodium). Avocado. Safflower, olive, or canola oils. Natural peanut or almond butter. Other Unsalted popcorn and pretzels. The items listed above may not be a complete list of recommended foods or beverages. Contact your dietitian for more options. WHAT FOODS ARE NOT RECOMMENDED? Grains White bread. White pasta. White rice. Refined cornbread. Bagels and croissants. Crackers that contain trans fat. Vegetables Creamed or fried vegetables. Vegetables in a cheese sauce. Regular canned vegetables. Regular canned tomato sauce and paste. Regular tomato and vegetable juices. Fruits Dried fruits. Canned fruit in light or heavy syrup. Fruit juice. Meat and Other Protein Products Fatty cuts of meat. Ribs, chicken wings, bacon, sausage, bologna, salami, chitterlings, fatback, hot dogs, bratwurst, and packaged luncheon meats. Salted nuts and seeds. Canned beans with salt. Dairy Whole or 2% milk, cream, half-and-half, and cream cheese. Whole-fat or sweetened yogurt. Full-fat   cheeses or blue cheese. Nondairy creamers and whipped toppings. Processed cheese, cheese spreads, or cheese  curds. Condiments Onion and garlic salt, seasoned salt, table salt, and sea salt. Canned and packaged gravies. Worcestershire sauce. Tartar sauce. Barbecue sauce. Teriyaki sauce. Soy sauce, including reduced sodium. Steak sauce. Fish sauce. Oyster sauce. Cocktail sauce. Horseradish. Ketchup and mustard. Meat flavorings and tenderizers. Bouillon cubes. Hot sauce. Tabasco sauce. Marinades. Taco seasonings. Relishes. Fats and Oils Butter, stick margarine, lard, shortening, ghee, and bacon fat. Coconut, palm kernel, or palm oils. Regular salad dressings. Other Pickles and olives. Salted popcorn and pretzels. The items listed above may not be a complete list of foods and beverages to avoid. Contact your dietitian for more information. WHERE CAN I FIND MORE INFORMATION? National Heart, Lung, and Blood Institute: www.nhlbi.nih.gov/health/health-topics/topics/dash/ Document Released: 08/24/2011 Document Revised: 01/19/2014 Document Reviewed: 07/09/2013 ExitCare Patient Information 2015 [employer]. This information is not intended to replace advice given to you by your health care provider. Make sure you discuss any questions you have with your health care provider. Diabetes Mellitus and Food It is important for you to manage your blood sugar (glucose) level. Your blood glucose level can be greatly affected by what you eat. Eating healthier foods in the appropriate amounts throughout the day at about the same time each day will help you control your blood glucose level. It can also help slow or prevent worsening of your diabetes mellitus. Healthy eating may even help you improve the level of your blood pressure and reach or maintain a healthy weight.  HOW CAN FOOD AFFECT ME? Carbohydrates Carbohydrates affect your blood glucose level more than any other type of food. Your dietitian will help you determine how many carbohydrates to eat at each meal and teach you how to count carbohydrates. Counting  carbohydrates is important to keep your blood glucose at a healthy level, especially if you are using insulin or taking certain medicines for diabetes mellitus. Alcohol Alcohol can cause sudden decreases in blood glucose (hypoglycemia), especially if you use insulin or take certain medicines for diabetes mellitus. Hypoglycemia can be a life-threatening condition. Symptoms of hypoglycemia (sleepiness, dizziness, and disorientation) are similar to symptoms of having too much alcohol.  If your health care provider has given you approval to drink alcohol, do so in moderation and use the following guidelines:  Women should not have more than one drink per day, and men should not have more than two drinks per day. One drink is equal to:  12 oz of beer.  5 oz of wine.  1 oz of hard liquor.  Do not drink on an empty stomach.  Keep yourself hydrated. Have water, diet soda, or unsweetened iced tea.  Regular soda, juice, and other mixers might contain a lot of carbohydrates and should be counted. WHAT FOODS ARE NOT RECOMMENDED? As you make food choices, it is important to remember that all foods are not the same. Some foods have fewer nutrients per serving than other foods, even though they might have the same number of calories or carbohydrates. It is difficult to get your body what it needs when you eat foods with fewer nutrients. Examples of foods that you should avoid that are high in calories and carbohydrates but low in nutrients include:  Trans fats (most processed foods list trans fats on the Nutrition Facts label).  Regular soda.  Juice.  Candy.  Sweets, such as cake, pie, doughnuts, and cookies.  Fried foods. WHAT FOODS CAN I EAT? Have nutrient-rich foods,   which will nourish your body and keep you healthy. The food you should eat also will depend on several factors, including:  The calories you need.  The medicines you take.  Your weight.  Your blood glucose level.  Your  blood pressure level.  Your cholesterol level. You also should eat a variety of foods, including:  Protein, such as meat, poultry, fish, tofu, nuts, and seeds (lean animal proteins are best).  Fruits.  Vegetables.  Dairy products, such as milk, cheese, and yogurt (low fat is best).  Breads, grains, pasta, cereal, rice, and beans.  Fats such as olive oil, trans fat-free margarine, canola oil, avocado, and olives. DOES EVERYONE WITH DIABETES MELLITUS HAVE THE SAME MEAL PLAN? Because every person with diabetes mellitus is different, there is not one meal plan that works for everyone. It is very important that you meet with a dietitian who will help you create a meal plan that is just right for you. Document Released: 06/01/2005 Document Revised: 09/09/2013 Document Reviewed: 08/01/2013 ExitCare Patient Information 2015 [employer]. This information is not intended to replace advice given to you by your health care provider. Make sure you discuss any questions you have with your health care provider. Smoking Cessation Quitting smoking is important to your health and has many advantages. However, it is not always easy to quit since nicotine is a very addictive drug. Oftentimes, people try 3 times or more before being able to quit. This document explains the best ways for you to prepare to quit smoking. Quitting takes hard work and a lot of effort, but you can do it. ADVANTAGES OF QUITTING SMOKING  You will live longer, feel better, and live better.  Your body will feel the impact of quitting smoking almost immediately.  Within 20 minutes, blood pressure decreases. Your pulse returns to its normal level.  After 8 hours, carbon monoxide levels in the blood return to normal. Your oxygen level increases.  After 24 hours, the chance of having a heart attack starts to decrease. Your breath, hair, and body stop smelling like smoke.  After 48 hours, damaged nerve endings begin to recover.  Your sense of taste and smell improve.  After 72 hours, the body is virtually free of nicotine. Your bronchial tubes relax and breathing becomes easier.  After 2 to 12 weeks, lungs can hold more air. Exercise becomes easier and circulation improves.  The risk of having a heart attack, stroke, cancer, or lung disease is greatly reduced.  After 1 year, the risk of coronary heart disease is cut in half.  After 5 years, the risk of stroke falls to the same as a nonsmoker.  After 10 years, the risk of lung cancer is cut in half and the risk of other cancers decreases significantly.  After 15 years, the risk of coronary heart disease drops, usually to the level of a nonsmoker.  If you are pregnant, quitting smoking will improve your chances of having a healthy baby.  The people you live with, especially any children, will be healthier.  You will have extra money to spend on things other than cigarettes. QUESTIONS TO THINK ABOUT BEFORE ATTEMPTING TO QUIT You may want to talk about your answers with your health care provider.  Why do you want to quit?  If you tried to quit in the past, what helped and what did not?  What will be the most difficult situations for you after you quit? How will you plan to handle them?  Who   can help you through the tough times? Your family? Friends? A health care provider?  What pleasures do you get from smoking? What ways can you still get pleasure if you quit? Here are some questions to ask your health care provider:  How can you help me to be successful at quitting?  What medicine do you think would be best for me and how should I take it?  What should I do if I need more help?  What is smoking withdrawal like? How can I get information on withdrawal? GET READY  Set a quit date.  Change your environment by getting rid of all cigarettes, ashtrays, matches, and lighters in your home, car, or work. Do not let people smoke in your home.  Review  your past attempts to quit. Think about what worked and what did not. GET SUPPORT AND ENCOURAGEMENT You have a better chance of being successful if you have help. You can get support in many ways.  Tell your family, friends, and coworkers that you are going to quit and need their support. Ask them not to smoke around you.  Get individual, group, or telephone counseling and support. Programs are available at local hospitals and health centers. Call your local health department for information about programs in your area.  Spiritual beliefs and practices may help some smokers quit.  Download a "quit meter" on your computer to keep track of quit statistics, such as how long you have gone without smoking, cigarettes not smoked, and money saved.  Get a self-help book about quitting smoking and staying off tobacco. LEARN NEW SKILLS AND BEHAVIORS  Distract yourself from urges to smoke. Talk to someone, go for a walk, or occupy your time with a task.  Change your normal routine. Take a different route to work. Drink tea instead of coffee. Eat breakfast in a different place.  Reduce your stress. Take a hot bath, exercise, or read a book.  Plan something enjoyable to do every day. Reward yourself for not smoking.  Explore interactive web-based programs that specialize in helping you quit. GET MEDICINE AND USE IT CORRECTLY Medicines can help you stop smoking and decrease the urge to smoke. Combining medicine with the above behavioral methods and support can greatly increase your chances of successfully quitting smoking.  Nicotine replacement therapy helps deliver nicotine to your body without the negative effects and risks of smoking. Nicotine replacement therapy includes nicotine gum, lozenges, inhalers, nasal sprays, and skin patches. Some may be available over-the-counter and others require a prescription.  Antidepressant medicine helps people abstain from smoking, but how this works is unknown.  This medicine is available by prescription.  Nicotinic receptor partial agonist medicine simulates the effect of nicotine in your brain. This medicine is available by prescription. Ask your health care provider for advice about which medicines to use and how to use them based on your health history. Your health care provider will tell you what side effects to look out for if you choose to be on a medicine or therapy. Carefully read the information on the package. Do not use any other product containing nicotine while using a nicotine replacement product.  RELAPSE OR DIFFICULT SITUATIONS Most relapses occur within the first 3 months after quitting. Do not be discouraged if you start smoking again. Remember, most people try several times before finally quitting. You may have symptoms of withdrawal because your body is used to nicotine. You may crave cigarettes, be irritable, feel very hungry, cough often, get   headaches, or have difficulty concentrating. The withdrawal symptoms are only temporary. They are strongest when you first quit, but they will go away within 10-14 days. To reduce the chances of relapse, try to:  Avoid drinking alcohol. Drinking lowers your chances of successfully quitting.  Reduce the amount of caffeine you consume. Once you quit smoking, the amount of caffeine in your body increases and can give you symptoms, such as a rapid heartbeat, sweating, and anxiety.  Avoid smokers because they can make you want to smoke.  Do not let weight gain distract you. Many smokers will gain weight when they quit, usually less than 10 pounds. Eat a healthy diet and stay active. You can always lose the weight gained after you quit.  Find ways to improve your mood other than smoking. FOR MORE INFORMATION  www.smokefree.gov  Document Released: 08/29/2001 Document Revised: 01/19/2014 Document Reviewed: 12/14/2011 ExitCare Patient Information 2015 [employer]. This information is not intended  to replace advice given to you by your health care provider. Make sure you discuss any questions you have with your health care provider.  

## 2015-01-07 NOTE — Progress Notes (Signed)
MRN: [id number] Name: Gregory Michael  Sex: male Age: 49 y.o. DOB: Aug 29, 1966  Allergies: Insulin detemir  Chief Complaint  Patient presents with  . Follow-up    HPI: Patient is 49 y.o. male who has history of hypertension, diabetes, tobacco abuse, comes today for followup her as per patient he is taking his medications regularly initially his blood pressure was elevated, repeat manual blood pressure is 130/80, patient denies any hypoglycemic symptoms but has not been checking his blood sugar level regularly, his hemoglobin A1c has trended down to 7.9%. Patient currently denies any acute symptoms denies any headache dizziness chest and shortness of breath. Patient is to smoke cigarettes, I have again counseled patient to quit smoking.  Past Medical History  Diagnosis Date  . Diabetes mellitus   . Heart murmur   . Hypertension     History reviewed. No pertinent past surgical history.    Medication List       This list is accurate as of: 01/07/15 10:18 AM.  Always use your most recent med list.               amoxicillin 500 MG capsule  Commonly known as:  AMOXIL  Take 1 capsule (500 mg total) by mouth 3 (three) times daily.     cyclobenzaprine 10 MG tablet  Commonly known as:  FLEXERIL  Take 1 tablet (10 mg total) by mouth 2 (two) times daily as needed for muscle spasms.     diclofenac 75 MG EC tablet  Commonly known as:  VOLTAREN  Take 1 tablet (75 mg total) by mouth 2 (two) times daily as needed.     diphenoxylate-atropine 2.5-0.025 MG per tablet  Commonly known as:  LOMOTIL  Take 1 tablet by mouth 3 (three) times daily before meals.     gabapentin 300 MG capsule  Commonly known as:  NEURONTIN  Take 1 capsule (300 mg total) by mouth 3 (three) times daily.     glipiZIDE 10 MG tablet  Commonly known as:  GLUCOTROL  Take 1 tablet (10 mg total) by mouth 2 (two) times daily before a meal.     glucose blood test strip  Commonly known as:  RELION CONFIRM/MICRO  TEST  Use as instructed     hydrocortisone 2.5 % cream  Apply topically 2 (two) times daily.     ibuprofen 600 MG tablet  Commonly known as:  ADVIL,MOTRIN  Take 1 tablet (600 mg total) by mouth every 8 (eight) hours as needed.     lisinopril-hydrochlorothiazide 10-12.5 MG per tablet  Commonly known as:  PRINZIDE,ZESTORETIC  Take 1 tablet by mouth daily.     meclizine 25 MG tablet  Commonly known as:  ANTIVERT  Take 1 tablet (25 mg total) by mouth 3 (three) times daily as needed for dizziness.     metFORMIN 1000 MG tablet  Commonly known as:  GLUCOPHAGE  Take 1 tablet (1,000 mg total) by mouth 2 (two) times daily with a meal.     nicotine polacrilex 4 MG gum  Commonly known as:  NICORETTE  Take 1 each (4 mg total) by mouth as needed for smoking cessation.     pravastatin 40 MG tablet  Commonly known as:  PRAVACHOL  Take 1 tablet (40 mg total) by mouth daily.     predniSONE 10 MG tablet  Commonly known as:  DELTASONE  Take 5 tab day 1, take 4 tab day 2, take 3 tab day 3, take 2 tab  day 4, and take 1 tab day 5     RELION CONFIRM GLUCOSE MONITOR W/DEVICE Kit  1 Units by Does not apply route as directed.     sildenafil 100 MG tablet  Commonly known as:  VIAGRA  Take 0.5-1 tablets (50-100 mg total) by mouth daily as needed for erectile dysfunction.     traMADol 50 MG tablet  Commonly known as:  ULTRAM  Take 1 tablet (50 mg total) by mouth every 6 (six) hours as needed.     Vitamin D (Ergocalciferol) 50000 UNITS Caps capsule  Commonly known as:  DRISDOL  Take 1 capsule (50,000 Units total) by mouth every 7 (seven) days.        No orders of the defined types were placed in this encounter.    Immunization History  Administered Date(s) Administered  . Influenza Split 08/01/2013  . Influenza,inj,Quad PF,36+ Mos 09/07/2014    History reviewed. No pertinent family history.  History  Substance Use Topics  . Smoking status: Current Every Day Smoker -- 0.50 packs/day      Types: Cigarettes  . Smokeless tobacco: Not on file  . Alcohol Use: Yes     Comment: rare    Review of Systems   As noted in HPI  Filed Vitals:   01/07/15 1015  BP: 130/80  Pulse:   Temp:   Resp:     Physical Exam  Physical Exam  Constitutional: No distress.  Eyes: EOM are normal. Pupils are equal, round, and reactive to light.  Cardiovascular: Normal rate and regular rhythm.   Pulmonary/Chest: Breath sounds normal. No respiratory distress. He has no wheezes. He has no rales.  Musculoskeletal: He exhibits no edema.    CBC    Component Value Date/Time   WBC 8.6 12/24/2012 1441   RBC 5.29 12/24/2012 1441   HGB 16.9 12/24/2012 1441   HCT 45.8 12/24/2012 1441   PLT 217 12/24/2012 1441   MCV 86.6 12/24/2012 1441   LYMPHSABS 2.4 10/21/2007 0330   MONOABS 0.9 10/21/2007 0330   EOSABS 0.1 10/21/2007 0330   BASOSABS 0.1 10/21/2007 0330    CMP     Component Value Date/Time   NA 138 04/27/2014 0905   K 3.7 04/27/2014 0905   CL 98 04/27/2014 0905   CO2 22 04/27/2014 0905   GLUCOSE 335* 04/27/2014 0905   BUN 10 04/27/2014 0905   CREATININE 0.97 04/27/2014 0905   CREATININE 0.78 12/24/2012 1441   CALCIUM 10.3 04/27/2014 0905   PROT 6.5 04/27/2014 0905   ALBUMIN 4.4 04/27/2014 0905   AST 17 04/27/2014 0905   ALT 27 04/27/2014 0905   ALKPHOS 62 04/27/2014 0905   BILITOT 0.7 04/27/2014 0905   GFRNONAA >89 04/27/2014 0905   GFRNONAA >90 12/24/2012 1441   GFRAA >89 04/27/2014 0905   GFRAA >90 12/24/2012 1441    Lab Results  Component Value Date/Time   CHOL 142 04/27/2014 09:05 AM    Lab Results  Component Value Date/Time   HGBA1C 7.90 01/07/2015 09:55 AM   HGBA1C 10.0* 12/24/2012 02:41 PM    Lab Results  Component Value Date/Time   AST 17 04/27/2014 09:05 AM    Assessment and Plan  Other specified diabetes mellitus without complications - Plan:  Results for orders placed or performed in visit on 01/07/15  Glucose (CBG)  Result Value Ref Range    POC Glucose 213.0 (A) 70 - 99 mg/dl  HgB N4N  Result Value Ref Range   Hemoglobin A1C 7.90  A1c has trended down, patient is taking metformin and Glucotrol,  Patient wants to work on his diet and does not want to change his medication, advise patient for dietary planning, advise patient to keep the fingerstick log and bring on the next visit, will repeat , HgB A1c in 3 months ,also check fasting Lipid panel  Essential hypertension - Plan: blood pressure is well-controlled continued current meds COMPLETE METABOLIC PANEL WITH GFR, Lipid panel  Smoking Counseled patient to quit smoking.   Return in about 3 months (around 04/08/2015) for diabetes, hypertension, fasting blood work in 3 weeks .   This note has been created with Surveyor, quantity. Any transcriptional errors are unintentional.    Lorayne Marek, MD

## 2015-01-07 NOTE — Progress Notes (Signed)
Patient here for follow up on her diabetes Since increasing his metformin patient states he has been tired  Patient complains of still having a rash to the back of his right leg

## 2015-01-21 ENCOUNTER — Ambulatory Visit: Payer: Self-pay | Attending: Internal Medicine

## 2015-01-21 DIAGNOSIS — E139 Other specified diabetes mellitus without complications: Secondary | ICD-10-CM

## 2015-01-21 DIAGNOSIS — E1169 Type 2 diabetes mellitus with other specified complication: Secondary | ICD-10-CM

## 2015-01-21 DIAGNOSIS — E785 Hyperlipidemia, unspecified: Principal | ICD-10-CM

## 2015-01-21 DIAGNOSIS — I10 Essential (primary) hypertension: Secondary | ICD-10-CM

## 2015-01-21 LAB — COMPLETE METABOLIC PANEL WITH GFR
ALT: 29 U/L (ref 0–53)
AST: 20 U/L (ref 0–37)
Albumin: 4.4 g/dL (ref 3.5–5.2)
Alkaline Phosphatase: 52 U/L (ref 39–117)
BUN: 11 mg/dL (ref 6–23)
CO2: 23 mEq/L (ref 19–32)
Calcium: 9.5 mg/dL (ref 8.4–10.5)
Chloride: 106 mEq/L (ref 96–112)
Creat: 0.9 mg/dL (ref 0.50–1.35)
GFR, Est African American: >89 mL/min
GFR, Est Non African American: >89 mL/min
Glucose, Bld: 130 mg/dL — ABNORMAL HIGH (ref 70–99)
Potassium: 4.1 mEq/L (ref 3.5–5.3)
Sodium: 140 mEq/L (ref 135–145)
Total Bilirubin: 0.7 mg/dL (ref 0.2–1.2)
Total Protein: 6.9 g/dL (ref 6.0–8.3)

## 2015-01-21 LAB — LIPID PANEL
CHOL/HDL RATIO: 3.7 ratio
CHOLESTEROL: 176 mg/dL (ref 0–200)
HDL: 47 mg/dL (ref >=40)
LDL CALC: 114 mg/dL — AB (ref 0–99)
Triglycerides: 75 mg/dL (ref <150)
VLDL: 15 mg/dL (ref 0–40)

## 2015-01-22 ENCOUNTER — Other Ambulatory Visit: Payer: Self-pay | Admitting: Family Medicine

## 2015-01-22 ENCOUNTER — Telehealth: Payer: Self-pay

## 2015-01-22 MED ORDER — ATORVASTATIN CALCIUM 40 MG PO TABS: 40.0000 mg | ORAL_TABLET | Freq: Every day | ORAL | Status: DC

## 2015-01-22 NOTE — Telephone Encounter (Signed)
-----   Message from Dessa PhiJosalyn Funches, MD sent at 01/22/2015  8:29 AM EDT ----- Normal CMP Lipid panel with slightly elevated LDL, patient high risk for heart disease given smoker, HTN, DM (risk 23.1%) Changed from pravastatin to lipitor 40 mg daily

## 2015-01-22 NOTE — Telephone Encounter (Signed)
Patient not available Left message on voice mail to return our call 

## 2015-02-17 ENCOUNTER — Other Ambulatory Visit: Payer: Self-pay | Admitting: Internal Medicine

## 2015-04-01 ENCOUNTER — Telehealth: Payer: Self-pay | Admitting: Internal Medicine

## 2015-04-01 DIAGNOSIS — I10 Essential (primary) hypertension: Secondary | ICD-10-CM

## 2015-04-01 NOTE — Telephone Encounter (Signed)
Pt calling to request refill on bp medication and glipiZIDE (GLUCOTROL) 10 MG tablet. Please f/u with pt.

## 2015-04-09 ENCOUNTER — Telehealth: Payer: Self-pay

## 2015-04-09 MED ORDER — LISINOPRIL-HYDROCHLOROTHIAZIDE 10-12.5 MG PO TABS: 1.0000 | ORAL_TABLET | Freq: Every day | ORAL | Status: DC

## 2015-04-09 MED ORDER — GLIPIZIDE 10 MG PO TABS: 10.0000 mg | ORAL_TABLET | Freq: Two times a day (BID) | ORAL | Status: DC

## 2015-04-09 NOTE — Telephone Encounter (Signed)
Pt calling to request refill on bp medication and glipiZIDE (GLUCOTROL) 10 MG tablet andlisinopril-hydrochlorothiazide (PRINZIDE,ZESTORETIC) 10-12.5 MG per tablet. Patient uses [facility].  Please f/u with pt.

## 2015-04-09 NOTE — Telephone Encounter (Signed)
Spoke with patient is he is aware his prescriptions were sent To the [facility] on file

## 2015-08-23 ENCOUNTER — Encounter: Payer: Self-pay | Admitting: Family Medicine

## 2015-08-23 ENCOUNTER — Ambulatory Visit: Payer: Self-pay | Attending: Family Medicine | Admitting: Family Medicine

## 2015-08-23 VITALS — BP 136/81 | HR 82 | Temp 99.0°F | Resp 16 | Ht 67.0 in | Wt 163.0 lb

## 2015-08-23 DIAGNOSIS — F17210 Nicotine dependence, cigarettes, uncomplicated: Secondary | ICD-10-CM | POA: Insufficient documentation

## 2015-08-23 DIAGNOSIS — Z7984 Long term (current) use of oral hypoglycemic drugs: Secondary | ICD-10-CM | POA: Insufficient documentation

## 2015-08-23 DIAGNOSIS — E1310 Other specified diabetes mellitus with ketoacidosis without coma: Secondary | ICD-10-CM | POA: Insufficient documentation

## 2015-08-23 DIAGNOSIS — Z0000 Encounter for general adult medical examination without abnormal findings: Secondary | ICD-10-CM | POA: Insufficient documentation

## 2015-08-23 DIAGNOSIS — E785 Hyperlipidemia, unspecified: Secondary | ICD-10-CM | POA: Insufficient documentation

## 2015-08-23 DIAGNOSIS — E118 Type 2 diabetes mellitus with unspecified complications: Secondary | ICD-10-CM

## 2015-08-23 DIAGNOSIS — Z79899 Other long term (current) drug therapy: Secondary | ICD-10-CM | POA: Insufficient documentation

## 2015-08-23 DIAGNOSIS — E1169 Type 2 diabetes mellitus with other specified complication: Secondary | ICD-10-CM | POA: Insufficient documentation

## 2015-08-23 DIAGNOSIS — F17200 Nicotine dependence, unspecified, uncomplicated: Secondary | ICD-10-CM | POA: Insufficient documentation

## 2015-08-23 DIAGNOSIS — G4700 Insomnia, unspecified: Secondary | ICD-10-CM | POA: Insufficient documentation

## 2015-08-23 DIAGNOSIS — Z720 Tobacco use: Secondary | ICD-10-CM | POA: Insufficient documentation

## 2015-08-23 DIAGNOSIS — I10 Essential (primary) hypertension: Secondary | ICD-10-CM | POA: Insufficient documentation

## 2015-08-23 LAB — GLUCOSE, POCT (MANUAL RESULT ENTRY): POC GLUCOSE: 158 mg/dL — AB (ref 70–99)

## 2015-08-23 LAB — BASIC METABOLIC PANEL
BUN: 9 mg/dL (ref 7–25)
CALCIUM: 9.1 mg/dL (ref 8.6–10.3)
CO2: 26 mmol/L (ref 20–31)
CREATININE: 0.89 mg/dL (ref 0.60–1.35)
Chloride: 102 mmol/L (ref 98–110)
GLUCOSE: 111 mg/dL — AB (ref 65–99)
Potassium: 3.8 mmol/L (ref 3.5–5.3)
Sodium: 138 mmol/L (ref 135–146)

## 2015-08-23 LAB — POCT GLYCOSYLATED HEMOGLOBIN (HGB A1C): HEMOGLOBIN A1C: 7.3

## 2015-08-23 MED ORDER — DIPHENHYDRAMINE HCL 25 MG PO TABS: 25.0000 mg | ORAL_TABLET | Freq: Every evening | ORAL | Status: DC | PRN

## 2015-08-23 MED ORDER — ATORVASTATIN CALCIUM 40 MG PO TABS: 40.0000 mg | ORAL_TABLET | Freq: Every day | ORAL | Status: AC

## 2015-08-23 MED ORDER — ZOLPIDEM TARTRATE 5 MG PO TABS: 5.0000 mg | ORAL_TABLET | Freq: Every evening | ORAL | Status: DC | PRN

## 2015-08-23 MED ORDER — GLIPIZIDE 10 MG PO TABS: 10.0000 mg | ORAL_TABLET | Freq: Two times a day (BID) | ORAL | Status: DC

## 2015-08-23 MED ORDER — LISINOPRIL-HYDROCHLOROTHIAZIDE 10-12.5 MG PO TABS: 1.0000 | ORAL_TABLET | Freq: Every day | ORAL | Status: DC

## 2015-08-23 MED ORDER — METFORMIN HCL 1000 MG PO TABS: 1000.0000 mg | ORAL_TABLET | Freq: Two times a day (BID) | ORAL | Status: DC

## 2015-08-23 NOTE — Assessment & Plan Note (Signed)
Improved. Continue regimen 

## 2015-08-23 NOTE — Patient Instructions (Addendum)
Gregory DanceKeith was seen today for hypertension, diabetes and sleeping problem.  Diagnoses and all orders for this visit:  Essential hypertension -     lisinopril-hydrochlorothiazide (PRINZIDE,ZESTORETIC) 10-12.5 MG tablet; Take 1 tablet by mouth daily. -     Basic Metabolic Panel -     atorvastatin (LIPITOR) 40 MG tablet; Take 1 tablet (40 mg total) by mouth daily.  Uncontrolled type 2 diabetes mellitus with ketoacidosis without coma, unspecified long term insulin use status (HCC) -     POCT glycosylated hemoglobin (Hb A1C) -     POCT glucose (manual entry) -     Microalbumin/Creatinine Ratio, Urine  Healthcare maintenance -     Flu Vaccine QUAD 36+ mos IM  Controlled type 2 diabetes mellitus with complication, without long-term current use of insulin (HCC) -     glipiZIDE (GLUCOTROL) 10 MG tablet; Take 1 tablet (10 mg total) by mouth 2 (two) times daily before a meal. -     metFORMIN (GLUCOPHAGE) 1000 MG tablet; Take 1 tablet (1,000 mg total) by mouth 2 (two) times daily with a meal. -     Basic Metabolic Panel  Smoking  Hyperlipidemia associated with type 2 diabetes mellitus (HCC) -     atorvastatin (LIPITOR) 40 MG tablet; Take 1 tablet (40 mg total) by mouth daily.  Insomnia -     diphenhydrAMINE (BENADRYL) 25 MG tablet; Take 1 tablet (25 mg total) by mouth at bedtime as needed for sleep (25-50). -     zolpidem (AMBIEN) 5 MG tablet; Take 1 tablet (5 mg total) by mouth at bedtime as needed for sleep.  remember to not check time/phone when trying to sleep  Try benadryl for 7-10 days first for insomnia, then ambien   F/u in 4-6 weeks for insomnia.   Dr. Armen PickupFunches

## 2015-08-23 NOTE — Progress Notes (Signed)
F/U DM HTN Medicine refills C/C unable to sleep and HA  Stated sleep about 2 hr and wake up  No pain today  Tobacco user 5 cigarette per day No suicidal thought in the past two weeks

## 2015-08-23 NOTE — Progress Notes (Signed)
Patient ID: Gregory Michael, male   DOB: 12/13/1965, 49 y.o.   MRN: [id number]   Subjective:  Patient ID: Gregory Michael, male    DOB: [birth year]  Age: 49 y.o. MRN: [id number]  CC: Hypertension; Diabetes; and Sleeping Problem   HPI Gregory Michael presents for   1. CHRONIC DIABETES  Disease Monitoring  Blood Sugar Ranges: well controlled   Polyuria: no   Visual problems: no   Medication Compliance: yes  Medication Side Effects  Hypoglycemia: no   Preventitive Health Care  Eye Exam: due   Foot Exam: done today   Diet pattern: eats fast food on the road,   Exercise: minimal   2. CHRONIC HYPERTENSION  Disease Monitoring  Blood pressure range: not checking   Chest pain: no   Dyspnea: no   Claudication: no   Medication compliance: yes  Medication Side Effects  Lightheadedness: no   Urinary frequency: no   Edema: no   Impotence: yes   Preventitive Healthcare:  Exercise: no   3. Trouble sleeping: patient works as a Administrator M-F. He sleeps for 2.5 hrs then wakes up. This has been an ongoing problem for 2 months. Sleeps at 5:30 PM. Wakes up at 8 PM. Unable to sleep for an extended period of time after this. Has slept through 5 AM alarm clock. Works from 6-6:30 AM to 3:30-5:00 PM.  Works as a Administrator. Drives from [place] to [place] M-F, comes back on Saturday AM. Has been on same work schedule from the past 7 months. He does not snore. Smokes. Drinks ETOH on Saturday. Drinks < 6 pack on Saturday. Always has stress. No new stressors. No depression. No history of insomnia. No OTC sleep aides.   Social History  Substance Use Topics  . Smoking status: Current Every Day Smoker -- 0.50 packs/day    Types: Cigarettes  . Smokeless tobacco: Not on file  . Alcohol Use: Yes     Comment: rare    Outpatient Prescriptions Prior to Visit  Medication Sig Dispense Refill  . amoxicillin (AMOXIL) 500 MG capsule Take 1 capsule (500 mg total) by mouth 3 (three) times daily.  21 capsule 0  . atorvastatin (LIPITOR) 40 MG tablet Take 1 tablet (40 mg total) by mouth daily. 30 tablet 11  . Blood Glucose Monitoring Suppl (RELION CONFIRM GLUCOSE MONITOR) W/DEVICE KIT 1 Units by Does not apply route as directed. 1 kit 1  . cyclobenzaprine (FLEXERIL) 10 MG tablet Take 1 tablet (10 mg total) by mouth 2 (two) times daily as needed for muscle spasms. 20 tablet 0  . diclofenac (VOLTAREN) 75 MG EC tablet Take 1 tablet (75 mg total) by mouth 2 (two) times daily as needed. 30 tablet 0  . diphenoxylate-atropine (LOMOTIL) 2.5-0.025 MG per tablet Take 1 tablet by mouth 3 (three) times daily before meals. 30 tablet 6  . gabapentin (NEURONTIN) 300 MG capsule Take 1 capsule (300 mg total) by mouth 3 (three) times daily. 30 capsule 0  . glipiZIDE (GLUCOTROL) 10 MG tablet Take 1 tablet (10 mg total) by mouth 2 (two) times daily before a meal. 60 tablet 3  . glucose blood (RELION CONFIRM/MICRO TEST) test strip Use as instructed 100 each 12  . hydrocortisone 2.5 % cream Apply topically 2 (two) times daily. 30 g 0  . ibuprofen (ADVIL,MOTRIN) 600 MG tablet Take 1 tablet (600 mg total) by mouth every 8 (eight) hours as needed. 30 tablet 0  . lisinopril-hydrochlorothiazide (PRINZIDE,ZESTORETIC) 10-12.5 MG per  tablet Take 1 tablet by mouth daily. 30 tablet 3  . meclizine (ANTIVERT) 25 MG tablet Take 1 tablet (25 mg total) by mouth 3 (three) times daily as needed for dizziness. 30 tablet 0  . metFORMIN (GLUCOPHAGE) 1000 MG tablet Take 1 tablet (1,000 mg total) by mouth 2 (two) times daily with a meal. 180 tablet 3  . nicotine polacrilex (NICORETTE) 4 MG gum Take 1 each (4 mg total) by mouth as needed for smoking cessation. 100 tablet 0  . predniSONE (DELTASONE) 10 MG tablet Take 5 tab day 1, take 4 tab day 2, take 3 tab day 3, take 2 tab day 4, and take 1 tab day 5 15 tablet 0  . sildenafil (VIAGRA) 100 MG tablet Take 0.5-1 tablets (50-100 mg total) by mouth daily as needed for erectile dysfunction. 5  tablet 5  . traMADol (ULTRAM) 50 MG tablet Take 1 tablet (50 mg total) by mouth every 6 (six) hours as needed. 15 tablet 0  . Vitamin D, Ergocalciferol, (DRISDOL) 50000 UNITS CAPS capsule Take 1 capsule (50,000 Units total) by mouth every 7 (seven) days. 12 capsule 0   No facility-administered medications prior to visit.    ROS Review of Systems  Constitutional: Negative for fever, chills, fatigue and unexpected weight change.  Eyes: Negative for visual disturbance.  Respiratory: Negative for cough and shortness of breath.   Cardiovascular: Negative for chest pain, palpitations and leg swelling.  Gastrointestinal: Negative for nausea, vomiting, abdominal pain, diarrhea, constipation and blood in stool.  Endocrine: Negative for polydipsia, polyphagia and polyuria.  Musculoskeletal: Negative for myalgias, back pain, arthralgias, gait problem and neck pain.  Skin: Negative for rash.  Allergic/Immunologic: Negative for immunocompromised state.  Neurological: Positive for headaches.  Hematological: Negative for adenopathy. Does not bruise/bleed easily.  Psychiatric/Behavioral: Positive for sleep disturbance. Negative for suicidal ideas and dysphoric mood. The patient is not nervous/anxious.     Objective:  BP 136/81 mmHg  Pulse 82  Temp(Src) 99 F (37.2 C) (Oral)  Resp 16  Ht $R'5\' 7"'CF$  (1.702 m)  Wt 163 lb (73.936 kg)  BMI 25.52 kg/m2  SpO2 98%  BP/Weight 08/23/2015 01/07/2015 12/18/270  Systolic BP 536 644 034  Diastolic BP 81 80 78  Wt. (Lbs) 163 160 -  BMI 25.52 25.05 -  Some encounter information is confidential and restricted. Go to Review Flowsheets activity to see all data.    Physical Exam  Constitutional: He appears well-developed and well-nourished. No distress.  HENT:  Head: Normocephalic and atraumatic.  Neck: Normal range of motion. Neck supple.  Cardiovascular: Normal rate, regular rhythm, normal heart sounds and intact distal pulses.   Pulmonary/Chest: Effort  normal and breath sounds normal.  Abdominal: Soft. Bowel sounds are normal. There is no tenderness.  Musculoskeletal: He exhibits no edema.  Neurological: He is alert.  Skin: Skin is warm and dry. No rash noted. No erythema.  Psychiatric: He has a normal mood and affect.    Lab Results  Component Value Date   HGBA1C 7.30 08/23/2015   CBG 158  Assessment & Plan:   Problem List Items Addressed This Visit    Controlled diabetes mellitus type 2 with complications (HCC) (Chronic)    Improved  Continue regimen       Relevant Medications   lisinopril-hydrochlorothiazide (PRINZIDE,ZESTORETIC) 10-12.5 MG tablet   glipiZIDE (GLUCOTROL) 10 MG tablet   metFORMIN (GLUCOPHAGE) 1000 MG tablet   atorvastatin (LIPITOR) 40 MG tablet   Other Relevant Orders   Basic Metabolic  Panel   DISORDER, TOBACCO USE    Smoking cessation addressed       RESOLVED: DM (diabetes mellitus) type 2, uncontrolled, with ketoacidosis (HCC)   Relevant Medications   lisinopril-hydrochlorothiazide (PRINZIDE,ZESTORETIC) 10-12.5 MG tablet   glipiZIDE (GLUCOTROL) 10 MG tablet   metFORMIN (GLUCOPHAGE) 1000 MG tablet   atorvastatin (LIPITOR) 40 MG tablet   Other Relevant Orders   POCT glycosylated hemoglobin (Hb A1C) (Completed)   POCT glucose (manual entry) (Completed)   Microalbumin/Creatinine Ratio, Urine   HTN (hypertension) - Primary (Chronic)    A: slightly elevated in setting of insomnia P: Treat insomnia BMP Continue current regimen       Relevant Medications   lisinopril-hydrochlorothiazide (PRINZIDE,ZESTORETIC) 10-12.5 MG tablet   atorvastatin (LIPITOR) 40 MG tablet   Other Relevant Orders   Basic Metabolic Panel   Insomnia    A: trouble sleeping x 2 months. 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 P: Discussed sleep hygiene  Trial of benadryl ambien if benadryl dose not help        Relevant Medications   diphenhydrAMINE (BENADRYL) 25 MG tablet   zolpidem (AMBIEN) 5 MG  tablet   Smoking (Chronic)    Other Visit Diagnoses    Healthcare maintenance        Relevant Orders    Flu Vaccine QUAD 36+ mos IM    Hyperlipidemia associated with type 2 diabetes mellitus (HCC)        Relevant Medications    lisinopril-hydrochlorothiazide (PRINZIDE,ZESTORETIC) 10-12.5 MG tablet    glipiZIDE (GLUCOTROL) 10 MG tablet    metFORMIN (GLUCOPHAGE) 1000 MG tablet    atorvastatin (LIPITOR) 40 MG tablet       No orders of the defined types were placed in this encounter.    Follow-up: No Follow-up on file.   Boykin Nearing MD

## 2015-08-23 NOTE — Assessment & Plan Note (Signed)
Smoking cessation addressed  

## 2015-08-23 NOTE — Assessment & Plan Note (Signed)
A: trouble sleeping x 2 months. 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 P: Discussed sleep hygiene  Trial of benadryl ambien if benadryl dose not help

## 2015-08-23 NOTE — Assessment & Plan Note (Signed)
A: slightly elevated in setting of insomnia P: Treat insomnia BMP Continue current regimen

## 2015-08-24 LAB — MICROALBUMIN / CREATININE URINE RATIO
CREATININE, URINE: 163 mg/dL (ref 20–370)
MICROALB/CREAT RATIO: 2 ug/mg{creat} (ref <30)
Microalb, Ur: 0.3 mg/dL

## 2015-08-30 ENCOUNTER — Telehealth: Payer: Self-pay

## 2015-08-30 NOTE — Telephone Encounter (Signed)
Called patient Patient not available Message left on voice mail to return our call

## 2015-08-30 NOTE — Telephone Encounter (Signed)
-----   Message from Dessa PhiJosalyn Funches, MD sent at 08/24/2015  9:09 AM EST ----- Normal urine microalbumin Normal BMP

## 2015-09-01 ENCOUNTER — Telehealth: Payer: Self-pay | Admitting: Family Medicine

## 2015-09-01 NOTE — Telephone Encounter (Signed)
Patient called back, please f/u  °

## 2015-09-01 NOTE — Telephone Encounter (Signed)
Date of birth verified by pt  Lab results given  Normal urine microalbumin Normal BMP Pt verbalized understanding

## 2015-09-01 NOTE — Telephone Encounter (Signed)
Patient returned phone call, please f/u °

## 2015-10-01 ENCOUNTER — Other Ambulatory Visit: Payer: Self-pay | Admitting: Internal Medicine

## 2015-10-01 NOTE — Telephone Encounter (Signed)
Patient called to request medication refill glipiZIDE (GLUCOTROL) 10 MG tablet [[id number]][[id number]]...Marland Kitchen.Marland Kitchen.[facility] to [facility] [place].

## 2016-04-09 ENCOUNTER — Encounter (HOSPITAL_COMMUNITY): Payer: Self-pay | Admitting: Emergency Medicine

## 2016-04-09 ENCOUNTER — Emergency Department (HOSPITAL_COMMUNITY)
Admission: EM | Admit: 2016-04-09 | Discharge: 2016-04-09 | Disposition: A | Discharge status: Home / Self Care | Payer: Self-pay | Attending: Emergency Medicine | Admitting: Emergency Medicine

## 2016-04-09 DIAGNOSIS — M549 Dorsalgia, unspecified: Secondary | ICD-10-CM

## 2016-04-09 DIAGNOSIS — E119 Type 2 diabetes mellitus without complications: Secondary | ICD-10-CM | POA: Insufficient documentation

## 2016-04-09 DIAGNOSIS — F17200 Nicotine dependence, unspecified, uncomplicated: Secondary | ICD-10-CM | POA: Insufficient documentation

## 2016-04-09 DIAGNOSIS — M62838 Other muscle spasm: Secondary | ICD-10-CM

## 2016-04-09 DIAGNOSIS — I10 Essential (primary) hypertension: Secondary | ICD-10-CM | POA: Insufficient documentation

## 2016-04-09 DIAGNOSIS — Z7984 Long term (current) use of oral hypoglycemic drugs: Secondary | ICD-10-CM | POA: Insufficient documentation

## 2016-04-09 DIAGNOSIS — M62830 Muscle spasm of back: Secondary | ICD-10-CM | POA: Insufficient documentation

## 2016-04-09 DIAGNOSIS — Z79899 Other long term (current) drug therapy: Secondary | ICD-10-CM | POA: Insufficient documentation

## 2016-04-09 MED ORDER — METHOCARBAMOL 500 MG PO TABS: 500.0000 mg | ORAL_TABLET | Freq: Four times a day (QID) | ORAL | 0 refills | Status: DC | PRN

## 2016-04-09 MED ORDER — MELOXICAM 7.5 MG PO TABS: 7.5000 mg | ORAL_TABLET | Freq: Every day | ORAL | 0 refills | Status: DC | PRN
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
## 2016-04-09 NOTE — ED Triage Notes (Signed)
Pt. 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

## 2016-04-09 NOTE — Discharge Instructions (Signed)
Read the information below.  Use the prescribed medication as directed.  Please discuss all new medications with your pharmacist.  You may return to the Emergency Department at any time for worsening condition or any new symptoms that concern you.     If you develop fevers, loss of control of bowel or bladder, weakness or numbness in your arms or legs, or are unable to walk, return to the ER for a recheck.   The mobic should not be sedating and you may take it during the day while you drive.  Reserve the robaxin for the times you do not need to drive.

## 2016-06-12 ENCOUNTER — Encounter (INDEPENDENT_AMBULATORY_CARE_PROVIDER_SITE_OTHER): Payer: Self-pay

## 2016-06-12 ENCOUNTER — Ambulatory Visit: Payer: Self-pay | Attending: Family Medicine | Admitting: Family Medicine

## 2016-06-12 ENCOUNTER — Encounter: Payer: Self-pay | Admitting: Family Medicine

## 2016-06-12 VITALS — BP 152/94 | HR 90 | Temp 98.4°F | Resp 17 | Ht 67.0 in | Wt 155.8 lb

## 2016-06-12 DIAGNOSIS — Z716 Tobacco abuse counseling: Secondary | ICD-10-CM

## 2016-06-12 DIAGNOSIS — I10 Essential (primary) hypertension: Secondary | ICD-10-CM

## 2016-06-12 DIAGNOSIS — Z79899 Other long term (current) drug therapy: Secondary | ICD-10-CM | POA: Insufficient documentation

## 2016-06-12 DIAGNOSIS — F17200 Nicotine dependence, unspecified, uncomplicated: Secondary | ICD-10-CM

## 2016-06-12 DIAGNOSIS — Z720 Tobacco use: Secondary | ICD-10-CM

## 2016-06-12 DIAGNOSIS — E118 Type 2 diabetes mellitus with unspecified complications: Secondary | ICD-10-CM

## 2016-06-12 DIAGNOSIS — Z7984 Long term (current) use of oral hypoglycemic drugs: Secondary | ICD-10-CM | POA: Insufficient documentation

## 2016-06-12 DIAGNOSIS — Z125 Encounter for screening for malignant neoplasm of prostate: Secondary | ICD-10-CM

## 2016-06-12 DIAGNOSIS — M542 Cervicalgia: Secondary | ICD-10-CM

## 2016-06-12 DIAGNOSIS — E1165 Type 2 diabetes mellitus with hyperglycemia: Secondary | ICD-10-CM | POA: Insufficient documentation

## 2016-06-12 DIAGNOSIS — G4700 Insomnia, unspecified: Secondary | ICD-10-CM

## 2016-06-12 DIAGNOSIS — F17210 Nicotine dependence, cigarettes, uncomplicated: Secondary | ICD-10-CM | POA: Insufficient documentation

## 2016-06-12 DIAGNOSIS — IMO0001 Reserved for inherently not codable concepts without codable children: Secondary | ICD-10-CM

## 2016-06-12 DIAGNOSIS — Z23 Encounter for immunization: Secondary | ICD-10-CM

## 2016-06-12 DIAGNOSIS — Z114 Encounter for screening for human immunodeficiency virus [HIV]: Secondary | ICD-10-CM

## 2016-06-12 LAB — COMPLETE METABOLIC PANEL WITH GFR
ALBUMIN: 4.6 g/dL (ref 3.6–5.1)
ALK PHOS: 56 U/L (ref 40–115)
ALT: 16 U/L (ref 9–46)
AST: 15 U/L (ref 10–40)
BILIRUBIN TOTAL: 0.8 mg/dL (ref 0.2–1.2)
BUN: 9 mg/dL (ref 7–25)
CALCIUM: 10 mg/dL (ref 8.6–10.3)
CO2: 28 mmol/L (ref 20–31)
Chloride: 104 mmol/L (ref 98–110)
Creat: 0.88 mg/dL (ref 0.60–1.35)
GLUCOSE: 221 mg/dL — AB (ref 65–99)
POTASSIUM: 4.7 mmol/L (ref 3.5–5.3)
Sodium: 140 mmol/L (ref 135–146)
TOTAL PROTEIN: 7 g/dL (ref 6.1–8.1)

## 2016-06-12 LAB — LDL CHOLESTEROL, DIRECT: LDL DIRECT: 120 mg/dL (ref <130)

## 2016-06-12 LAB — CBC
HEMATOCRIT: 42.6 % (ref 38.5–50.0)
Hemoglobin: 14.8 g/dL (ref 13.2–17.1)
MCH: 32 pg (ref 27.0–33.0)
MCHC: 34.7 g/dL (ref 32.0–36.0)
MCV: 92 fL (ref 80.0–100.0)
MPV: 11.4 fL (ref 7.5–12.5)
PLATELETS: 236 10*3/uL (ref 140–400)
RBC: 4.63 MIL/uL (ref 4.20–5.80)
RDW: 13.6 % (ref 11.0–15.0)
WBC: 8.7 10*3/uL (ref 3.8–10.8)

## 2016-06-12 LAB — PSA: PSA: 0.5 ng/mL (ref <=4.0)

## 2016-06-12 LAB — POCT GLYCOSYLATED HEMOGLOBIN (HGB A1C): Hemoglobin A1C: 7.5

## 2016-06-12 LAB — HEMOCCULT GUIAC POC 1CARD (OFFICE): Fecal Occult Blood, POC: NEGATIVE

## 2016-06-12 LAB — GLUCOSE, POCT (MANUAL RESULT ENTRY): POC Glucose: 241 mg/dl — AB (ref 70–99)

## 2016-06-12 MED ORDER — LISINOPRIL-HYDROCHLOROTHIAZIDE 10-12.5 MG PO TABS: 1.0000 | ORAL_TABLET | Freq: Every day | ORAL | 3 refills | Status: AC

## 2016-06-12 MED ORDER — METFORMIN HCL ER 500 MG PO TB24: 1000.0000 mg | ORAL_TABLET | Freq: Two times a day (BID) | ORAL | 3 refills | Status: AC

## 2016-06-12 MED ORDER — GLIPIZIDE 10 MG PO TABS: 10.0000 mg | ORAL_TABLET | Freq: Two times a day (BID) | ORAL | 3 refills | Status: AC

## 2016-06-12 MED ORDER — BUPROPION HCL ER (SR) 150 MG PO TB12: 150.0000 mg | ORAL_TABLET | Freq: Two times a day (BID) | ORAL | 3 refills | Status: AC

## 2016-06-12 MED ORDER — METHOCARBAMOL 500 MG PO TABS: 500.0000 mg | ORAL_TABLET | Freq: Four times a day (QID) | ORAL | 2 refills | Status: AC | PRN

## 2016-06-12 MED ORDER — ZOLPIDEM TARTRATE 5 MG PO TABS: 5.0000 mg | ORAL_TABLET | Freq: Every evening | ORAL | 5 refills | Status: AC | PRN

## 2016-06-12 NOTE — Progress Notes (Deleted)
SUBJECTIVE:  Gregory Michael is a 50 y.o. male presenting for his annual checkup.  1. HTN  2. Diabetes:  Social History  Substance Use Topics  . Smoking status: Current Every Day Smoker    Packs/day: 0.50    Types: Cigarettes  . Smokeless tobacco: Current User  . Alcohol use Yes     Comment: rare   Past Medical History:  Diagnosis Date  . Diabetes mellitus   . Heart murmur   . Hypertension    Current Outpatient Prescriptions  Medication Sig Dispense Refill  . atorvastatin (LIPITOR) 40 MG tablet Take 1 tablet (40 mg total) by mouth daily. 90 tablet 3  . Blood Glucose Monitoring Suppl (RELION CONFIRM GLUCOSE MONITOR) W/DEVICE KIT 1 Units by Does not apply route as directed. 1 kit 1  . diphenhydrAMINE (BENADRYL) 25 MG tablet Take 1 tablet (25 mg total) by mouth at bedtime as needed for sleep (25-50). 60 tablet 2  . glipiZIDE (GLUCOTROL) 10 MG tablet Take 1 tablet (10 mg total) by mouth 2 (two) times daily before a meal. 180 tablet 3  . glucose blood (RELION CONFIRM/MICRO TEST) test strip Use as instructed 100 each 12  . lisinopril-hydrochlorothiazide (PRINZIDE,ZESTORETIC) 10-12.5 MG tablet Take 1 tablet by mouth daily. 90 tablet 3  . meloxicam (MOBIC) 7.5 MG tablet Take 1 tablet (7.5 mg total) by mouth daily as needed for pain. 10 tablet 0  . metFORMIN (GLUCOPHAGE) 1000 MG tablet Take 1 tablet (1,000 mg total) by mouth 2 (two) times daily with a meal. 180 tablet 3  . methocarbamol (ROBAXIN) 500 MG tablet Take 1-2 tablets (500-1,000 mg total) by mouth every 6 (six) hours as needed for muscle spasms. 20 tablet 0  . zolpidem (AMBIEN) 5 MG tablet Take 1 tablet (5 mg total) by mouth at bedtime as needed for sleep. 30 tablet 0   No current facility-administered medications for this visit.    Allergies: Insulin detemir   ROS:  Feeling well. No dyspnea or chest pain on exertion. No abdominal pain, change in bowel habits, black or bloody stools. No urinary tract or prostatic symptoms. No  neurological complaints.  OBJECTIVE:  The patient appears well, alert, oriented x 3, in no distress.  There were no vitals taken for this visit. ENT normal.  Neck supple. No adenopathy or thyromegaly. PERLA. Lungs are clear, good air entry, no wheezes, rhonchi or rales. S1 and S2 normal, no murmurs, regular rate and rhythm. Abdomen is soft without tenderness, guarding, mass or organomegaly. GU exam: {pe male genitalia:310182::"no penile lesions or discharge, no testicular masses or tenderness, no hernias"}.  Extremities show no edema, normal peripheral pulses. Neurological is normal without focal findings.  ASSESSMENT:  healthy adult male  PLAN:  {lifestyle advice:315251}

## 2016-06-12 NOTE — Assessment & Plan Note (Signed)
A: HTN uncontrolled Med: compliant P: Recommended increase in prinzide dose, patient declined Continue current prinzide dose Low salt diet Smoking cessation  Repeat BP at close f/u in 2 weeks

## 2016-06-12 NOTE — Patient Instructions (Addendum)
Mellody DanceKeith was seen today for back pain, diabetes and hypertension.  Diagnoses and all orders for this visit:  Controlled type 2 diabetes mellitus with complication, without long-term current use of insulin (HCC) -     HgB A1c -     Glucose (CBG) -     COMPLETE METABOLIC PANEL WITH GFR -     LDL Cholesterol, Direct -     metFORMIN (GLUCOPHAGE XR) 500 MG 24 hr tablet; Take 2 tablets (1,000 mg total) by mouth 2 (two) times daily. -     glipiZIDE (GLUCOTROL) 10 MG tablet; Take 1 tablet (10 mg total) by mouth 2 (two) times daily before a meal.  Screening for HIV (human immunodeficiency virus) -     HIV antibody (with reflex)  Screening PSA (prostate specific antigen) -     PSA -     CBC -     Hemoccult - 1 Card (office)  Essential hypertension -     lisinopril-hydrochlorothiazide (PRINZIDE,ZESTORETIC) 10-12.5 MG tablet; Take 1 tablet by mouth daily.  Encounter for smoking cessation counseling -     buPROPion (WELLBUTRIN SR) 150 MG 12 hr tablet; Take 1 tablet (150 mg total) by mouth 2 (two) times daily. Take 150 mg once daily for 3 days then twice daily  Neck pain -     methocarbamol (ROBAXIN) 500 MG tablet; Take 1-2 tablets (500-1,000 mg total) by mouth every 6 (six) hours as needed for muscle spasms.  Other orders -     Cancel: Glucose (CBG) -     Cancel: HgB A1c   You will be contacted with lab results   F/u in 2-3 weeks to review labs and discuss neck and muscle pain s  Dr. Armen PickupFunches

## 2016-06-12 NOTE — Assessment & Plan Note (Signed)
A: current smoker motivated to quit following cancer related death of older brother P: Add wellbutrin for cessation

## 2016-06-12 NOTE — Progress Notes (Addendum)
Subjective:  Patient ID: Gregory Michael, male    DOB: 1966/06/17  Age: 50 y.o. MRN: [id number]  CC: Back Pain; Diabetes; and Hypertension   HPI [name] has HTN, DM2, smoking he presents for   1. DM2: taking glipizide and metformin. No medication this AM. He does not miss doses. Usually low sugar but putting splenda in coffee. Reports weight loss and urgent stools.   2. HTN:  Taking prinzide 10-12.5 mg daily. Having HA, vision is poor (reports needing new glasses). No CP, SOB or leg swelling.   3. Brother recently passed away from cancer: he is not sure if it was prostate or colon cancer. Brother passed away 05-23-16 at age 57.  He is feeling stressed.  He admits to weight loss. Denies blood in stool. Denies urinary hesitancy. He is a smoking to 1/4 PPD.   4. Smoking: down to 1/4 PPD. Desires to quit. No CP or SOB. Has loss some weight.   Social History  Substance Use Topics  . Smoking status: Current Every Day Smoker    Packs/day: 0.25    Types: Cigarettes  . Smokeless tobacco: Current User  . Alcohol use Yes     Comment: rare    Outpatient Medications Prior to Visit  Medication Sig Dispense Refill  . atorvastatin (LIPITOR) 40 MG tablet Take 1 tablet (40 mg total) by mouth daily. 90 tablet 3  . Blood Glucose Monitoring Suppl (RELION CONFIRM GLUCOSE MONITOR) W/DEVICE KIT 1 Units by Does not apply route as directed. 1 kit 1  . diphenhydrAMINE (BENADRYL) 25 MG tablet Take 1 tablet (25 mg total) by mouth at bedtime as needed for sleep (25-50). 60 tablet 2  . glipiZIDE (GLUCOTROL) 10 MG tablet Take 1 tablet (10 mg total) by mouth 2 (two) times daily before a meal. 180 tablet 3  . glucose blood (RELION CONFIRM/MICRO TEST) test strip Use as instructed 100 each 12  . lisinopril-hydrochlorothiazide (PRINZIDE,ZESTORETIC) 10-12.5 MG tablet Take 1 tablet by mouth daily. 90 tablet 3  . meloxicam (MOBIC) 7.5 MG tablet Take 1 tablet (7.5 mg total) by mouth daily as needed for pain.  10 tablet 0  . metFORMIN (GLUCOPHAGE) 1000 MG tablet Take 1 tablet (1,000 mg total) by mouth 2 (two) times daily with a meal. 180 tablet 3  . methocarbamol (ROBAXIN) 500 MG tablet Take 1-2 tablets (500-1,000 mg total) by mouth every 6 (six) hours as needed for muscle spasms. 20 tablet 0  . zolpidem (AMBIEN) 5 MG tablet Take 1 tablet (5 mg total) by mouth at bedtime as needed for sleep. 30 tablet 0   No facility-administered medications prior to visit.     ROS Review of Systems  Constitutional: Positive for unexpected weight change. Negative for chills, fatigue and fever.  Eyes: Negative for visual disturbance.  Respiratory: Negative for cough and shortness of breath.   Cardiovascular: Negative for chest pain, palpitations and leg swelling.  Gastrointestinal: Negative for abdominal pain, blood in stool, constipation, diarrhea, nausea and vomiting.  Endocrine: Positive for polyuria. Negative for polydipsia and polyphagia.  Musculoskeletal: Positive for arthralgias, myalgias (cramps and pains in feet ) and neck pain. Negative for back pain and gait problem.  Skin: Negative for rash.  Allergic/Immunologic: Negative for immunocompromised state.  Neurological: Positive for headaches.  Hematological: Negative for adenopathy. Does not bruise/bleed easily.  Psychiatric/Behavioral: Negative for dysphoric mood, sleep disturbance and suicidal ideas. The patient is not nervous/anxious.     Objective:  BP (!) 152/94 (  BP Location: Right Arm, Patient Position: Sitting, Cuff Size: Small)   Pulse 90   Temp 98.4 F (36.9 C) (Oral)   Resp 17   Ht '5\' 7"'$  (1.702 m)   Wt 155 lb 12.8 oz (70.7 kg)   SpO2 98%   BMI 24.40 kg/m   BP/Weight 06/12/2016 04/09/2016 72/01/3663  Systolic BP 403 474 259  Diastolic BP 94 87 81  Wt. (Lbs) 155.8 164 163  BMI 24.4 25.31 25.52  Some encounter information is confidential and restricted. Go to Review Flowsheets activity to see all data.   Wt Readings from Last 3  Encounters:  06/12/16 155 lb 12.8 oz (70.7 kg)  04/09/16 164 lb (74.4 kg)  08/23/15 163 lb (73.9 kg)   Physical Exam  Constitutional: He appears well-developed and well-nourished. No distress.  HENT:  Head: Normocephalic and atraumatic.  Neck: Normal range of motion. Neck supple.  Cardiovascular: Normal rate, regular rhythm, normal heart sounds and intact distal pulses.   Pulmonary/Chest: Effort normal and breath sounds normal.  Genitourinary: Rectum normal and prostate normal. Rectal exam shows no external hemorrhoid, no internal hemorrhoid, no fissure, no mass, no tenderness, anal tone normal and guaiac negative stool.  Musculoskeletal: He exhibits no edema.  Neurological: He is alert.  Skin: Skin is warm and dry. No rash noted. No erythema.  Psychiatric: He has a normal mood and affect.    Lab Results  Component Value Date   HGBA1C 7.5 06/12/2016   CBG 241 Assessment & Plan:   Samantha was seen today for back pain, diabetes and hypertension.  Diagnoses and all orders for this visit:  Uncontrolled type 2 diabetes mellitus without complication, without long-term current use of insulin (HCC) -     HgB A1c -     Glucose (CBG) -     COMPLETE METABOLIC PANEL WITH GFR -     LDL Cholesterol, Direct -     metFORMIN (GLUCOPHAGE XR) 500 MG 24 hr tablet; Take 2 tablets (1,000 mg total) by mouth 2 (two) times daily. -     glipiZIDE (GLUCOTROL) 10 MG tablet; Take 1 tablet (10 mg total) by mouth 2 (two) times daily before a meal. -     Ambulatory referral to Ophthalmology  Screening for HIV (human immunodeficiency virus) -     HIV antibody (with reflex)  Screening PSA (prostate specific antigen) -     PSA -     CBC -     Hemoccult - 1 Card (office)  Essential hypertension -     lisinopril-hydrochlorothiazide (PRINZIDE,ZESTORETIC) 10-12.5 MG tablet; Take 1 tablet by mouth daily.  Encounter for smoking cessation counseling -     buPROPion (WELLBUTRIN SR) 150 MG 12 hr tablet; Take 1  tablet (150 mg total) by mouth 2 (two) times daily. Take 150 mg once daily for 3 days then twice daily  Neck pain -     methocarbamol (ROBAXIN) 500 MG tablet; Take 1-2 tablets (500-1,000 mg total) by mouth every 6 (six) hours as needed for muscle spasms.  Insomnia -     zolpidem (AMBIEN) 5 MG tablet; Take 1 tablet (5 mg total) by mouth at bedtime as needed for sleep.  Encounter for immunization -     HgB A1c -     Glucose (CBG) -     HIV antibody (with reflex) -     COMPLETE METABOLIC PANEL WITH GFR -     PSA -     LDL Cholesterol, Direct -  CBC -     Hemoccult - 1 Card (office) -     metFORMIN (GLUCOPHAGE XR) 500 MG 24 hr tablet; Take 2 tablets (1,000 mg total) by mouth 2 (two) times daily. -     buPROPion (WELLBUTRIN SR) 150 MG 12 hr tablet; Take 1 tablet (150 mg total) by mouth 2 (two) times daily. Take 150 mg once daily for 3 days then twice daily -     lisinopril-hydrochlorothiazide (PRINZIDE,ZESTORETIC) 10-12.5 MG tablet; Take 1 tablet by mouth daily. -     methocarbamol (ROBAXIN) 500 MG tablet; Take 1-2 tablets (500-1,000 mg total) by mouth every 6 (six) hours as needed for muscle spasms. -     glipiZIDE (GLUCOTROL) 10 MG tablet; Take 1 tablet (10 mg total) by mouth 2 (two) times daily before a meal. -     zolpidem (AMBIEN) 5 MG tablet; Take 1 tablet (5 mg total) by mouth at bedtime as needed for sleep. -     Flu Vaccine QUAD 36+ mos IM -     Ambulatory referral to Ophthalmology  Smoking  Needs flu shot -     Flu Vaccine QUAD 36+ mos IM  Other orders -     Cancel: Glucose (CBG) -     Cancel: HgB A1c   No orders of the defined types were placed in this encounter.   Follow-up: Return in about 2 weeks (around 06/26/2016) for neck pain, lab review .   Boykin Nearing MD

## 2016-06-12 NOTE — Progress Notes (Signed)
Pt is in today for a physical, BP check and CBG check  Pt complains of back and shoulder pain  Pt complains of having trouble sleeping  Pt needs refill on atorvastatin  Pt has not eaten or had medications today  Pt consents to getting Tdap and Inlfuenza today

## 2016-06-12 NOTE — Assessment & Plan Note (Addendum)
A: uncontrolled with hyperglycemia. Patient is not tolerating metformin  P: Change metformin to XR 1000 mg BID Add januvia 100 mg daily Continue glipizide 10 mg BID

## 2016-06-12 NOTE — Assessment & Plan Note (Signed)
A: MSK neck pain this is chronic P: Robaxin refilled F/u in 2 weeks for focused exam

## 2016-06-13 ENCOUNTER — Telehealth: Payer: Self-pay

## 2016-06-13 DIAGNOSIS — E1165 Type 2 diabetes mellitus with hyperglycemia: Principal | ICD-10-CM

## 2016-06-13 DIAGNOSIS — IMO0001 Reserved for inherently not codable concepts without codable children: Secondary | ICD-10-CM

## 2016-06-13 LAB — HIV ANTIBODY (ROUTINE TESTING W REFLEX): HIV 1&2 Ab, 4th Generation: NONREACTIVE

## 2016-06-13 NOTE — Telephone Encounter (Signed)
-----   Message from Yancey FlemingsKimberly R Becton, RN sent at 06/13/2016 12:26 PM EDT ----- Results

## 2016-06-13 NOTE — Telephone Encounter (Signed)
Pt. returned call. Please f/u °

## 2016-06-14 NOTE — Telephone Encounter (Signed)
Pt returning call to review results, please f/up

## 2016-06-19 NOTE — Telephone Encounter (Signed)
Patient given nurse message. Patient understood

## 2016-06-19 NOTE — Telephone Encounter (Signed)
Advised returning call re: lab results;  When patient calls please advise per Dr Armen PickupFunches:All labs normal except for high blood sugar which was discussed at office visit.  Be sure to take all diabetes medication including the newest addition, [place]uvia

## 2016-06-19 NOTE — Telephone Encounter (Signed)
Pt was called. on 10/02 no answer.

## 2016-06-19 NOTE — Telephone Encounter (Signed)
Patient needs [place]uvia sent to pharmacy

## 2016-06-19 NOTE — Telephone Encounter (Signed)
Pt returning call from nurse

## 2016-06-20 MED ORDER — SITAGLIPTIN PHOSPHATE 100 MG PO TABS: 100.0000 mg | ORAL_TABLET | Freq: Every day | ORAL | 3 refills | Status: AC

## 2016-06-20 NOTE — Telephone Encounter (Signed)
Sent januvia to patient's [facility]

## 2016-06-26 ENCOUNTER — Ambulatory Visit: Payer: Self-pay | Admitting: Family Medicine

## 2017-01-01 ENCOUNTER — Encounter (HOSPITAL_COMMUNITY): Payer: Self-pay

## 2017-01-01 ENCOUNTER — Emergency Department (HOSPITAL_COMMUNITY): Payer: Self-pay

## 2017-01-01 ENCOUNTER — Emergency Department (HOSPITAL_COMMUNITY)
Admission: EM | Admit: 2017-01-01 | Discharge: 2017-01-01 | Disposition: A | Discharge status: Home / Self Care | Payer: Self-pay | Attending: Emergency Medicine | Admitting: Emergency Medicine

## 2017-01-01 DIAGNOSIS — F17210 Nicotine dependence, cigarettes, uncomplicated: Secondary | ICD-10-CM | POA: Insufficient documentation

## 2017-01-01 DIAGNOSIS — I10 Essential (primary) hypertension: Secondary | ICD-10-CM | POA: Insufficient documentation

## 2017-01-01 DIAGNOSIS — Z7984 Long term (current) use of oral hypoglycemic drugs: Secondary | ICD-10-CM | POA: Insufficient documentation

## 2017-01-01 DIAGNOSIS — E119 Type 2 diabetes mellitus without complications: Secondary | ICD-10-CM | POA: Insufficient documentation

## 2017-01-01 DIAGNOSIS — Z79899 Other long term (current) drug therapy: Secondary | ICD-10-CM | POA: Insufficient documentation

## 2017-01-01 DIAGNOSIS — H109 Unspecified conjunctivitis: Secondary | ICD-10-CM | POA: Insufficient documentation

## 2017-01-01 LAB — BASIC METABOLIC PANEL
ANION GAP: 9 (ref 5–15)
BUN: 10 mg/dL (ref 6–20)
CALCIUM: 9.5 mg/dL (ref 8.9–10.3)
CO2: 27 mmol/L (ref 22–32)
Chloride: 103 mmol/L (ref 101–111)
Creatinine, Ser: 0.86 mg/dL (ref 0.61–1.24)
GFR calc Af Amer: >60 mL/min (ref >60)
GFR calc non Af Amer: >60 mL/min (ref >60)
GLUCOSE: 175 mg/dL — AB (ref 65–99)
Potassium: 4 mmol/L (ref 3.5–5.1)
Sodium: 139 mmol/L (ref 135–145)

## 2017-01-01 LAB — I-STAT TROPONIN, ED
TROPONIN I, POC: 0 ng/mL (ref 0.00–0.08)
Troponin i, poc: 0 ng/mL (ref 0.00–0.08)

## 2017-01-01 LAB — CBC
HCT: 44.2 % (ref 39.0–52.0)
Hemoglobin: 15 g/dL (ref 13.0–17.0)
MCH: 30.8 pg (ref 26.0–34.0)
MCHC: 33.9 g/dL (ref 30.0–36.0)
MCV: 90.8 fL (ref 78.0–100.0)
PLATELETS: 208 10*3/uL (ref 150–400)
RBC: 4.87 MIL/uL (ref 4.22–5.81)
RDW: 13.4 % (ref 11.5–15.5)
WBC: 7.7 10*3/uL (ref 4.0–10.5)

## 2017-01-01 LAB — CBG MONITORING, ED: Glucose-Capillary: 182 mg/dL — ABNORMAL HIGH (ref 65–99)

## 2017-01-01 MED ORDER — FLUORESCEIN SODIUM 0.6 MG OP STRP
1.0000 | ORAL_STRIP | Freq: Once | OPHTHALMIC | Status: AC
  Administered 2017-01-01: 1 via OPHTHALMIC
  Filled 2017-01-01: qty 1

## 2017-01-01 MED ORDER — TETRACAINE HCL 0.5 % OP SOLN
1.0000 [drp] | Freq: Once | OPHTHALMIC | Status: AC
  Administered 2017-01-01: 1 [drp] via OPHTHALMIC
  Filled 2017-01-01: qty 2

## 2017-01-01 MED ORDER — ERYTHROMYCIN 5 MG/GM OP OINT
1.0000 "application " | TOPICAL_OINTMENT | Freq: Four times a day (QID) | OPHTHALMIC | Status: DC
  Administered 2017-01-01: 1 via OPHTHALMIC
  Filled 2017-01-01: qty 3.5

## 2017-01-01 NOTE — ED Notes (Signed)
Resident at bedside.  

## 2017-01-01 NOTE — ED Notes (Signed)
Pt departed in NAD, refused use of wheelchair.  

## 2017-01-01 NOTE — ED Provider Notes (Signed)
[facility] DEPT Provider Note   CSN: [id number] Arrival date & time: 01/01/17  1300     History   Chief Complaint Chief Complaint  Patient presents with  . Chest Pain  . Eye Problem    HPI Gregory Michael is a 51 y.o. male.  The history is provided by the patient and medical records.  Eye Problem   This is a new problem. The current episode started 2 days ago. The problem occurs constantly. The problem has been gradually worsening. There is a problem in the right eye. Injury mechanism: Thinks he got something in it 2days ago. The pain is moderate. There is no history of trauma to the eye. There is no known exposure to pink eye. He does not wear contacts. Associated symptoms include blurred vision, discharge and eye redness. Pertinent negatives include no numbness, no foreign body sensation, no nausea, no vomiting, no tingling, no weakness and no itching. He has tried water for the symptoms. The treatment provided no relief.  Chest Pain   This is a new problem. The current episode started yesterday. The problem occurs constantly. Progression since onset: greatly improved, but still present. Associated with: Stress. The pain is present in the substernal region. The pain is moderate. Quality: "tight" The pain does not radiate. Associated symptoms include shortness of breath. Pertinent negatives include no abdominal pain, no back pain, no cough, no diaphoresis, no dizziness, no fever, no lower extremity edema, no nausea, no numbness, no palpitations, no vomiting and no weakness. He has tried nothing for the symptoms. Risk factors include male gender, smoking/tobacco exposure and stress.  His past medical history is significant for diabetes and hypertension.  Pertinent negatives for past medical history include no seizures.    Past Medical History:  Diagnosis Date  . Diabetes mellitus   . Heart murmur   . Hypertension     Patient Active Problem List   Diagnosis Date Noted  . Neck  pain 06/12/2016  . Insomnia 08/23/2015  . Smoking 02/03/2014  . HTN (hypertension) 04/10/2013  . Unspecified vitamin D deficiency 04/10/2013  . Hyperlipidemia 12/24/2012  . Uncontrolled diabetes mellitus type 2 without complications ([place]) 32/99/2426  . DISORDER, TOBACCO USE 05/31/2007    History reviewed. No pertinent surgical history.     Home Medications    Prior to Admission medications   Medication Sig Start Date End Date Taking? Authorizing Provider  atorvastatin (LIPITOR) 40 MG tablet Take 1 tablet (40 mg total) by mouth daily. 08/23/15   Josalyn Funches, MD  Blood Glucose Monitoring Suppl (RELION CONFIRM GLUCOSE MONITOR) W/DEVICE KIT 1 Units by Does not apply route as directed. 04/10/13   Clanford Marisa Hua, MD  buPROPion (WELLBUTRIN SR) 150 MG 12 hr tablet Take 1 tablet (150 mg total) by mouth 2 (two) times daily. Take 150 mg once daily for 3 days then twice daily 06/12/16   Boykin Nearing, MD  glipiZIDE (GLUCOTROL) 10 MG tablet Take 1 tablet (10 mg total) by mouth 2 (two) times daily before a meal. 06/12/16   Josalyn Funches, MD  glucose blood (RELION CONFIRM/MICRO TEST) test strip Use as instructed 04/10/13   Clanford Marisa Hua, MD  lisinopril-hydrochlorothiazide (PRINZIDE,ZESTORETIC) 10-12.5 MG tablet Take 1 tablet by mouth daily. 06/12/16   Josalyn Funches, MD  metFORMIN (GLUCOPHAGE XR) 500 MG 24 hr tablet Take 2 tablets (1,000 mg total) by mouth 2 (two) times daily. 06/12/16   Josalyn Funches, MD  methocarbamol (ROBAXIN) 500 MG tablet Take 1-2 tablets (500-1,000 mg total)  by mouth every 6 (six) hours as needed for muscle spasms. 06/12/16   Josalyn Funches, MD  sitaGLIPtin (JANUVIA) 100 MG tablet Take 1 tablet (100 mg total) by mouth daily. 06/20/16 06/20/17  Josalyn Funches, MD  zolpidem (AMBIEN) 5 MG tablet Take 1 tablet (5 mg total) by mouth at bedtime as needed for sleep. 06/12/16   Boykin Nearing, MD    Family History Family History  Problem Relation Age of Onset  . Heart  disease Mother   . Heart disease Maternal Aunt   . Diabetes Maternal Grandfather   . Cancer Brother     prostate cancer     Social History Social History  Substance Use Topics  . Smoking status: Current Every Day Smoker    Packs/day: 0.25    Types: Cigarettes  . Smokeless tobacco: Current User  . Alcohol use Yes     Comment: rare     Allergies   Insulin detemir   Review of Systems Review of Systems  Constitutional: Negative for chills, diaphoresis and fever.  HENT: Negative for ear pain and sore throat.   Eyes: Positive for blurred vision, discharge and redness. Negative for pain and visual disturbance.  Respiratory: Positive for shortness of breath. Negative for cough.   Cardiovascular: Positive for chest pain. Negative for palpitations.  Gastrointestinal: Negative for abdominal pain, nausea and vomiting.  Genitourinary: Negative for dysuria and hematuria.  Musculoskeletal: Negative for arthralgias and back pain.  Skin: Negative for color change, itching and rash.  Neurological: Negative for dizziness, tingling, seizures, syncope, weakness and numbness.  All other systems reviewed and are negative.    Physical Exam Updated Vital Signs BP (!) 149/82   Pulse (!) 56   Temp 98.1 F (36.7 C) (Oral)   Resp 14   SpO2 98%   Physical Exam  Constitutional: He appears well-developed and well-nourished.  HENT:  Head: Normocephalic and atraumatic.  Eyes: Pupils are equal, round, and reactive to light.  Right eye with chemosis & scleral injection, anterior chamber w/o blood or white cells, no pain w/eye movement, no FB sensation  Neck: Neck supple.  Cardiovascular: Normal rate and regular rhythm.   No murmur heard. Pulmonary/Chest: Effort normal and breath sounds normal. No respiratory distress.  Abdominal: Soft. There is no tenderness.  Musculoskeletal: He exhibits no edema.  Neurological: He is alert.  Skin: Skin is warm and dry.  Psychiatric: He has a normal mood  and affect.  Nursing note and vitals reviewed.   ED Treatments / Results  Labs (all labs ordered are listed, but only abnormal results are displayed) Labs Reviewed  BASIC METABOLIC PANEL - Abnormal; Notable for the following:       Result Value   Glucose, Bld 175 (*)    All other components within normal limits  CBG MONITORING, ED - Abnormal; Notable for the following:    Glucose-Capillary 182 (*)    All other components within normal limits  CBC  I-STAT TROPOININ, ED  I-STAT TROPOININ, ED    EKG  EKG Interpretation  Date/Time:  Monday January 01 2017 13:07:04 EDT Ventricular Rate:  89 PR Interval:  150 QRS Duration: 72 QT Interval:  372 QTC Calculation: 452 R Axis:   -7 Text Interpretation:  Normal sinus rhythm Possible Left atrial enlargement Borderline ECG No significant change since last tracing Confirmed by LITTLE MD, RACHEL [id number]) on 01/01/2017 5:39:30 PM       Radiology Dg Chest 2 View  Result Date: 01/01/2017 CLINICAL DATA:  Chest  pain and shortness of Breath EXAM: CHEST  2 VIEW COMPARISON:  None. FINDINGS: The heart size and mediastinal contours are within normal limits. Both lungs are clear. The visualized skeletal structures are unremarkable. IMPRESSION: No active cardiopulmonary disease. Electronically Signed   By: Inez Catalina M.D.   On: 01/01/2017 13:29    Procedures Procedures (including critical care time)  Medications Ordered in ED Medications  erythromycin ophthalmic ointment 1 application (1 application Right Eye Given 01/01/17 1953)  tetracaine (PONTOCAINE) 0.5 % ophthalmic solution 1 drop (1 drop Right Eye Given 01/01/17 1922)  fluorescein ophthalmic strip 1 strip (1 strip Right Eye Given 01/01/17 1923)     Initial Impression / Assessment and Plan / ED Course  I have reviewed the triage vital signs and the nursing notes.  Pertinent labs & imaging results that were available during my care of the patient were reviewed by me and considered in my  medical decision making (see chart for details).    Pt with h/o HTN, HLD, DM presents with right eye pain and CP. Says 2days ago he felt like he got something in his eye; he's flushed it multiple times, and no longer has a FB sensation, but says his eye still hurts, his vision is blurry, and he's woken up with crusting. In addition, he complains of "crushing" chest "tightness" that started last night, is localized to the substernal area, and has not been associated w/diaphoresis, SOB, N/V. His symptoms continued today, so he decided to come in for evaluation. Endorses significant stress in his life, but denies F/C, HA, lightheadedness, cough, recent illness, or sick contacts.  VS & exam as above. EKG: NSR @ 89bpm w/o signs of ischemia. CXR WNL. Labs unremarkable, including 2x undetectable troponin. Doubt emergent cause of CP given history, exam, and work-up.  Fluorescein staining w/o uptake to suggest corneal abrasion; Romycin ointment ordered.  Explained all results to the Pt. Will discharge the Pt home with Romycin ointment. Recommending follow-up with PCP. ED return precautions provided. Pt acknowledged understanding of, and concurrence with the plan. All questions answered to his satisfaction. In stable condition at the time of discharge.  Final Clinical Impressions(s) / ED Diagnoses   Final diagnoses:  Conjunctivitis of right eye, unspecified conjunctivitis type    New Prescriptions New Prescriptions   No medications on file     Jenny Reichmann, MD 01/01/17 2115    Sharlett Iles, MD 01/09/17 2005

## 2017-01-01 NOTE — ED Notes (Signed)
Woods lamp and [employer], tetracaine, flourescein at bedside

## 2017-01-01 NOTE — ED Triage Notes (Signed)
Pt states he began having swelling and redness in his right eye yesterday. He believes something may have gotten in it. He reports a crushing chest pain that started this morning. He reports some shortness of breath but denies other associated symptoms.

## 2019-01-25 IMAGING — CR DG CHEST 2V
2 series · 2 of 2 positions shown · non-contrast
Comparison: None.

CLINICAL DATA: Chest pain and shortness of Breath

EXAM:
CHEST  2 VIEW

[chest pa]
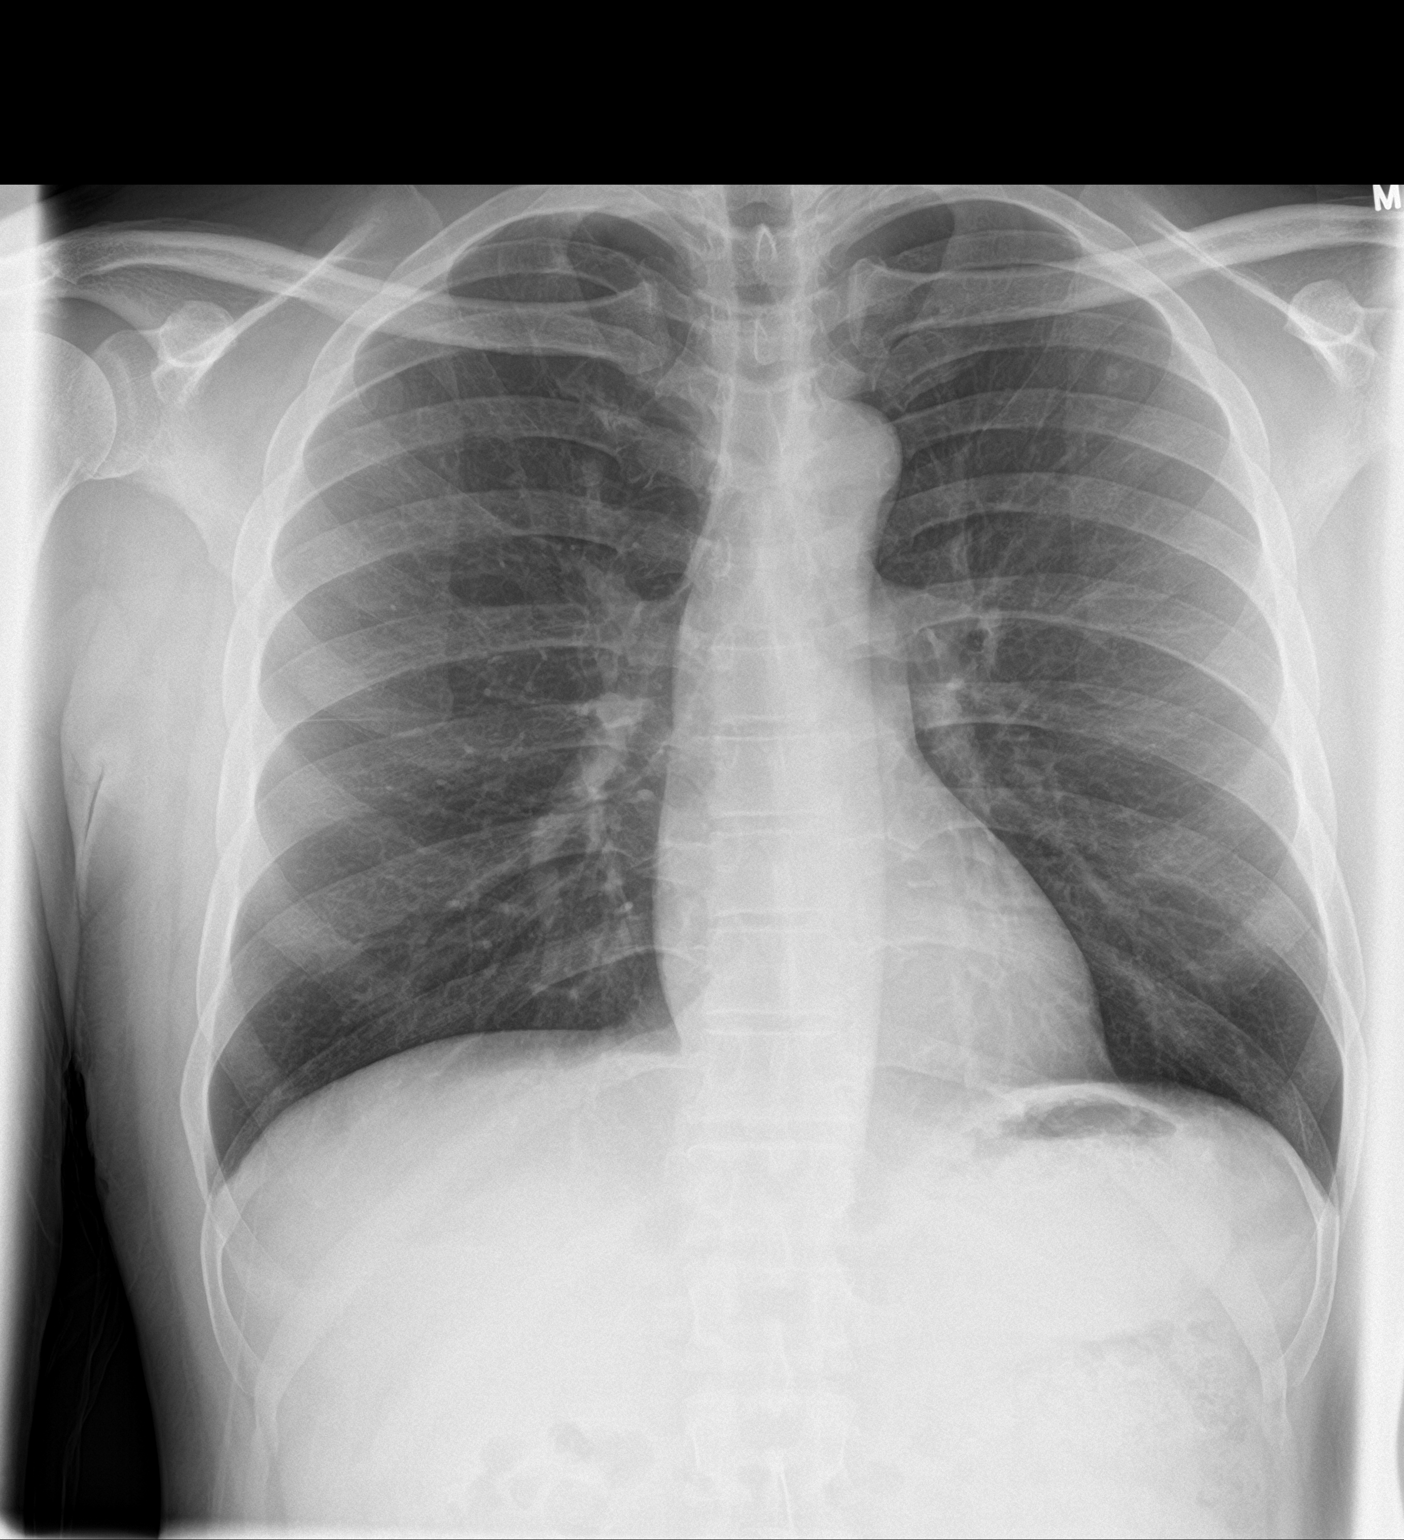

[chest lat]
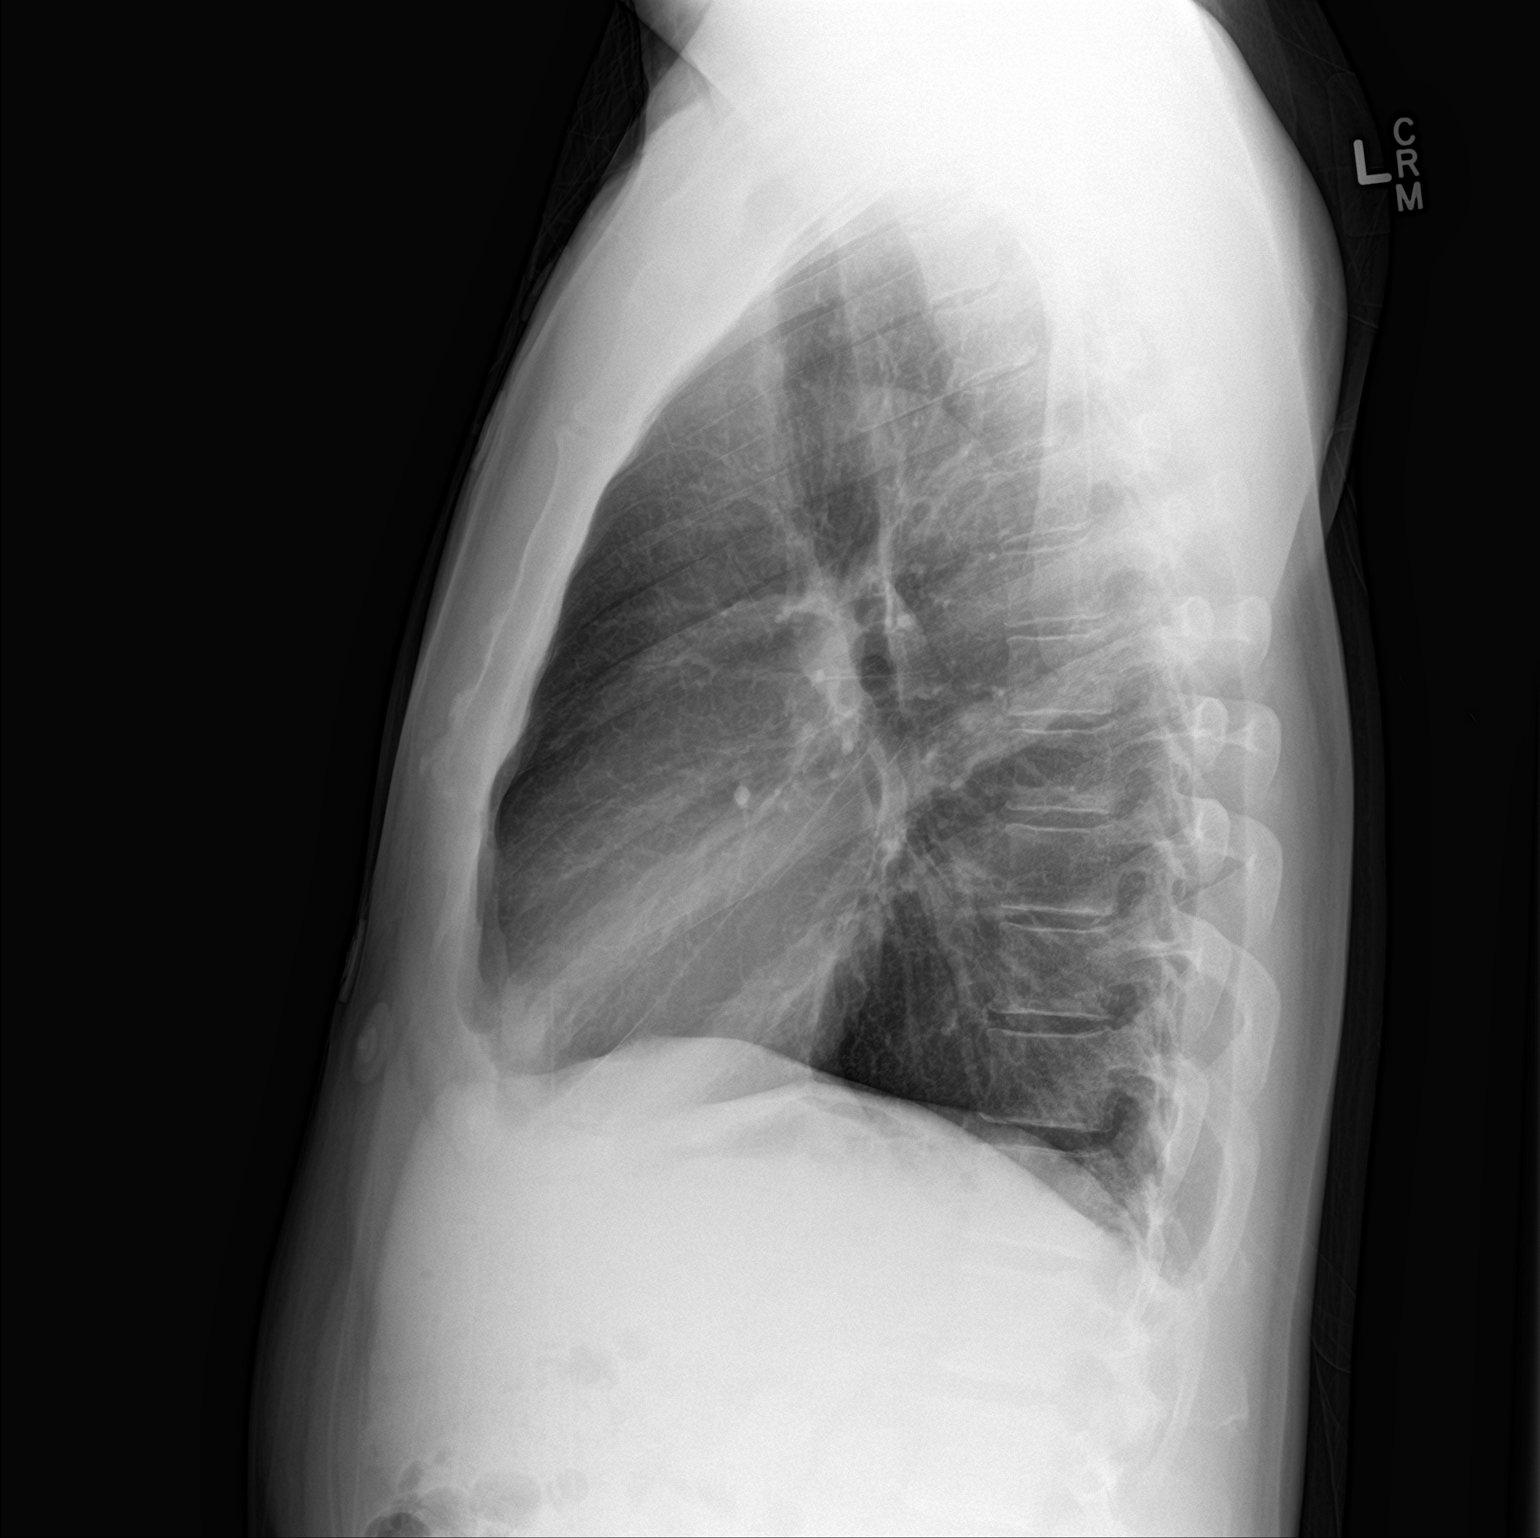

[2 of 2 positions shown; findings below may reference images not displayed]

FINDINGS: The heart size and mediastinal contours are within normal limits.
Both lungs are clear. The visualized skeletal structures are
unremarkable.
IMPRESSION: No active cardiopulmonary disease.
